# Patient Record
Sex: Female | Born: 1935
Health system: Southern US, Community
[De-identification: ages and names within clinical notes are randomized; demographics above are authoritative.]

## PROBLEM LIST (undated history)

## (undated) DIAGNOSIS — I219 Acute myocardial infarction, unspecified: Secondary | ICD-10-CM

## (undated) DIAGNOSIS — I739 Peripheral vascular disease, unspecified: Secondary | ICD-10-CM

## (undated) DIAGNOSIS — J309 Allergic rhinitis, unspecified: Secondary | ICD-10-CM

## (undated) DIAGNOSIS — I2510 Atherosclerotic heart disease of native coronary artery without angina pectoris: Secondary | ICD-10-CM

## (undated) DIAGNOSIS — J189 Pneumonia, unspecified organism: Secondary | ICD-10-CM

## (undated) DIAGNOSIS — E119 Type 2 diabetes mellitus without complications: Secondary | ICD-10-CM

## (undated) DIAGNOSIS — G309 Alzheimer's disease, unspecified: Secondary | ICD-10-CM

## (undated) DIAGNOSIS — I6529 Occlusion and stenosis of unspecified carotid artery: Secondary | ICD-10-CM

## (undated) DIAGNOSIS — J449 Chronic obstructive pulmonary disease, unspecified: Secondary | ICD-10-CM

## (undated) DIAGNOSIS — E785 Hyperlipidemia, unspecified: Secondary | ICD-10-CM

## (undated) DIAGNOSIS — F0280 Dementia in other diseases classified elsewhere without behavioral disturbance: Secondary | ICD-10-CM

## (undated) HISTORY — DX: Acute myocardial infarction, unspecified: I21.9

## (undated) HISTORY — DX: Alzheimer's disease, unspecified: G30.9

## (undated) HISTORY — DX: Peripheral vascular disease, unspecified: I73.9

## (undated) HISTORY — DX: Pneumonia, unspecified organism: J18.9

## (undated) HISTORY — DX: Allergic rhinitis, unspecified: J30.9

## (undated) HISTORY — DX: Atherosclerotic heart disease of native coronary artery without angina pectoris: I25.10

## (undated) HISTORY — DX: Type 2 diabetes mellitus without complications: E11.9

## (undated) HISTORY — DX: Chronic obstructive pulmonary disease, unspecified: J44.9

## (undated) HISTORY — PX: CARDIAC CATHETERIZATION: SHX172

## (undated) HISTORY — DX: Hyperlipidemia, unspecified: E78.5

## (undated) HISTORY — DX: Occlusion and stenosis of unspecified carotid artery: I65.29

## (undated) HISTORY — DX: Dementia in other diseases classified elsewhere, unspecified severity, without behavioral disturbance, psychotic disturbance, mood disturbance, and anxiety: F02.80

## (undated) HISTORY — PX: OTHER SURGICAL HISTORY: SHX169

---

## 2011-02-19 DIAGNOSIS — J189 Pneumonia, unspecified organism: Secondary | ICD-10-CM

## 2011-02-19 HISTORY — DX: Pneumonia, unspecified organism: J18.9

## 2013-06-26 DIAGNOSIS — I70209 Unspecified atherosclerosis of native arteries of extremities, unspecified extremity: Secondary | ICD-10-CM | POA: Diagnosis not present

## 2013-06-26 DIAGNOSIS — B351 Tinea unguium: Secondary | ICD-10-CM | POA: Diagnosis not present

## 2013-06-26 DIAGNOSIS — M79609 Pain in unspecified limb: Secondary | ICD-10-CM | POA: Diagnosis not present

## 2014-01-05 DIAGNOSIS — F0390 Unspecified dementia without behavioral disturbance: Secondary | ICD-10-CM | POA: Diagnosis not present

## 2014-01-05 DIAGNOSIS — E785 Hyperlipidemia, unspecified: Secondary | ICD-10-CM | POA: Diagnosis not present

## 2014-01-05 DIAGNOSIS — I2510 Atherosclerotic heart disease of native coronary artery without angina pectoris: Secondary | ICD-10-CM | POA: Diagnosis not present

## 2014-01-22 ENCOUNTER — Encounter: Payer: Self-pay | Admitting: *Deleted

## 2014-01-25 ENCOUNTER — Encounter: Payer: Self-pay | Admitting: Cardiovascular Disease

## 2014-01-25 ENCOUNTER — Ambulatory Visit (INDEPENDENT_AMBULATORY_CARE_PROVIDER_SITE_OTHER): Payer: Medicare Other | Admitting: Cardiovascular Disease

## 2014-01-25 VITALS — BP 160/73 | HR 71 | Ht 65.0 in | Wt 180.0 lb

## 2014-01-25 DIAGNOSIS — I739 Peripheral vascular disease, unspecified: Secondary | ICD-10-CM

## 2014-01-25 DIAGNOSIS — I2510 Atherosclerotic heart disease of native coronary artery without angina pectoris: Secondary | ICD-10-CM | POA: Diagnosis not present

## 2014-01-25 DIAGNOSIS — E785 Hyperlipidemia, unspecified: Secondary | ICD-10-CM | POA: Diagnosis not present

## 2014-01-25 NOTE — Assessment & Plan Note (Signed)
She is currently on simvastatin 40 mg once daily. Most recent lipid profile showed a total cholesterol of 175, triglyceride of 196, HDL of 60 and LDL of 76.

## 2014-01-25 NOTE — Progress Notes (Signed)
Primary care physician: Dr. Marguerite Olea  HPI  This is a pleasant 80 year old female who was referred to establish cardiovascular care. She is accompanied by her daughter. The [name] herself is not able to provide an accurate history due to advanced dementia. She has known history of coronary artery disease status post myocardial infarction and stent placement in 1997. The daughter also reports history of peripheral arterial disease with possible endovascular intervention in 2013 as well as nonobstructive carotid artery disease which was being followed by ultrasound. She does not know the name of her cardiologist in [place]. The [name] moved to live with her daughter. Other medical conditions include hypertension, hyperlipidemia and dementia. It is no history of diabetes or stroke. The [name] denies any chest pain, shortness of breath or palpitations.  No Known Allergies   Current Outpatient Prescriptions on File Prior to Visit  Medication Sig Dispense Refill  . aspirin 81 MG tablet Take 81 mg by mouth daily.       . cetirizine (ZYRTEC) 10 MG tablet Take 10 mg by mouth daily.       . clopidogrel (PLAVIX) 75 MG tablet Take 75 mg by mouth daily.       Marland Kitchen donepezil (ARICEPT) 10 MG tablet Take 10 mg by mouth at bedtime.       . metoprolol tartrate (LOPRESSOR) 25 MG tablet Take 25 mg by mouth 2 (two) times daily.       . simvastatin (ZOCOR) 40 MG tablet Take 40 mg by mouth daily.        No current facility-administered medications on file prior to visit.     Past Medical History  Diagnosis Date  . Dementia   . Coronary atherosclerosis of unspecified type of vessel, native or graft   . Other and unspecified hyperlipidemia   . Allergic rhinitis, cause unspecified   . MI (myocardial infarction)   . Diabetes mellitus without complication   . COPD (chronic obstructive pulmonary disease)   . Carotid stenosis   . PAD (peripheral artery disease)      Past Surgical History  Procedure  Laterality Date  . Cardiac catheterization      [place] x1 stent     Family History  Problem Relation Age of Onset  . Heart disease Mother   . Diabetes Father      History   Social History  . Marital Status: Widowed    Spouse Name: N/A    Number of Children: N/A  . Years of Education: N/A   Occupational History  . Not on file.   Social History Main Topics  . Smoking status: Former Smoker -- 60 years    Types: Cigarettes  . Smokeless tobacco: Not on file  . Alcohol Use: No  . Drug Use: No  . Sexual Activity: Not on file   Other Topics Concern  . Not on file   Social History Narrative  . No narrative on file     ROS A 10 point review of system was performed. It is negative other than that mentioned in the history of present illness.   PHYSICAL EXAM   BP 160/73  Pulse 71  Ht  (1.651 m)  Wt 180 lb (81.647 kg)  BMI 29.95 kg/m2 Constitutional: She is not oriented to person, place, and time. She appears well-developed and well-nourished. No distress.  HENT: No nasal discharge.  Head: Normocephalic and atraumatic.  Eyes: Pupils are equal and round. No discharge.  Neck: Normal range of  motion. Neck supple. No JVD present. No thyromegaly present.  Cardiovascular: Normal rate, regular rhythm, normal heart sounds. Exam reveals no gallop and no friction rub. No murmur heard.  Pulmonary/Chest: Effort normal and breath sounds normal. No stridor. No respiratory distress. She has no wheezes. She has no rales. She exhibits no tenderness.  Abdominal: Soft. Bowel sounds are normal. She exhibits no distension. There is no tenderness. There is no rebound and no guarding.  Musculoskeletal: Normal range of motion. She exhibits no edema and no tenderness.  Neurological: She is alert and oriented to person, place, and time. Coordination normal.  Skin: Skin is warm and dry. No rash noted. She is not diaphoretic. No erythema. No pallor.  Psychiatric: She has a normal  mood and affect. Her behavior is normal. Judgment and thought content normal.     XBJ:YNWGN  Rhythm  WITHIN NORMAL LIMITS   ASSESSMENT AND PLAN

## 2014-01-25 NOTE — Assessment & Plan Note (Signed)
According to the daughter, she does complain of occasional pain in her legs but does not know which side. Continue medical therapy for now.

## 2014-01-25 NOTE — Assessment & Plan Note (Signed)
The patient denies any anginal symptoms. I recommend continuing medical therapy. Dual antiplatelet therapy is optional at the present time. However, given the presence of peripheral arterial disease and nonobstructive carotid disease, it's probably beneficial to continue.

## 2014-01-25 NOTE — Patient Instructions (Signed)
Continue same medications.   Your physician wants you to follow-up in: 6 months.  You will receive a reminder letter in the mail two months in advance. If you don't receive a letter, please call our office to schedule the follow-up appointment.  

## 2014-02-04 ENCOUNTER — Inpatient Hospital Stay: Payer: Self-pay | Admitting: Internal Medicine

## 2014-02-04 DIAGNOSIS — R531 Weakness: Secondary | ICD-10-CM | POA: Diagnosis not present

## 2014-02-04 DIAGNOSIS — J9601 Acute respiratory failure with hypoxia: Secondary | ICD-10-CM | POA: Diagnosis present

## 2014-02-04 DIAGNOSIS — J440 Chronic obstructive pulmonary disease with acute lower respiratory infection: Secondary | ICD-10-CM | POA: Diagnosis present

## 2014-02-04 DIAGNOSIS — I739 Peripheral vascular disease, unspecified: Secondary | ICD-10-CM | POA: Diagnosis present

## 2014-02-04 DIAGNOSIS — I2510 Atherosclerotic heart disease of native coronary artery without angina pectoris: Secondary | ICD-10-CM | POA: Diagnosis not present

## 2014-02-04 DIAGNOSIS — Z87891 Personal history of nicotine dependence: Secondary | ICD-10-CM | POA: Diagnosis not present

## 2014-02-04 DIAGNOSIS — E1165 Type 2 diabetes mellitus with hyperglycemia: Secondary | ICD-10-CM | POA: Diagnosis not present

## 2014-02-04 DIAGNOSIS — F0390 Unspecified dementia without behavioral disturbance: Secondary | ICD-10-CM | POA: Diagnosis not present

## 2014-02-04 DIAGNOSIS — J449 Chronic obstructive pulmonary disease, unspecified: Secondary | ICD-10-CM | POA: Diagnosis not present

## 2014-02-04 DIAGNOSIS — F0280 Dementia in other diseases classified elsewhere without behavioral disturbance: Secondary | ICD-10-CM | POA: Diagnosis present

## 2014-02-04 DIAGNOSIS — R911 Solitary pulmonary nodule: Secondary | ICD-10-CM | POA: Diagnosis not present

## 2014-02-04 DIAGNOSIS — J441 Chronic obstructive pulmonary disease with (acute) exacerbation: Secondary | ICD-10-CM | POA: Diagnosis not present

## 2014-02-04 DIAGNOSIS — Z823 Family history of stroke: Secondary | ICD-10-CM | POA: Diagnosis not present

## 2014-02-04 DIAGNOSIS — J9691 Respiratory failure, unspecified with hypoxia: Secondary | ICD-10-CM | POA: Diagnosis not present

## 2014-02-04 DIAGNOSIS — E119 Type 2 diabetes mellitus without complications: Secondary | ICD-10-CM | POA: Diagnosis not present

## 2014-02-04 DIAGNOSIS — I10 Essential (primary) hypertension: Secondary | ICD-10-CM | POA: Diagnosis present

## 2014-02-04 DIAGNOSIS — N179 Acute kidney failure, unspecified: Secondary | ICD-10-CM | POA: Diagnosis not present

## 2014-02-04 DIAGNOSIS — J90 Pleural effusion, not elsewhere classified: Secondary | ICD-10-CM | POA: Diagnosis not present

## 2014-02-04 DIAGNOSIS — G309 Alzheimer's disease, unspecified: Secondary | ICD-10-CM | POA: Diagnosis present

## 2014-02-04 DIAGNOSIS — E860 Dehydration: Secondary | ICD-10-CM | POA: Diagnosis present

## 2014-02-04 DIAGNOSIS — R918 Other nonspecific abnormal finding of lung field: Secondary | ICD-10-CM | POA: Diagnosis not present

## 2014-02-04 DIAGNOSIS — J189 Pneumonia, unspecified organism: Secondary | ICD-10-CM | POA: Diagnosis not present

## 2014-02-04 DIAGNOSIS — Z833 Family history of diabetes mellitus: Secondary | ICD-10-CM | POA: Diagnosis not present

## 2014-02-04 DIAGNOSIS — Z7982 Long term (current) use of aspirin: Secondary | ICD-10-CM | POA: Diagnosis not present

## 2014-02-04 LAB — URINALYSIS, COMPLETE
BILIRUBIN, UR: NEGATIVE
LEUKOCYTE ESTERASE: NEGATIVE
NITRITE: NEGATIVE
Ph: 5 (ref 4.5–8.0)
Specific Gravity: 1.025 (ref 1.003–1.030)

## 2014-02-04 LAB — CBC
HCT: 42.1 % (ref 35.0–47.0)
HGB: 13.8 g/dL (ref 12.0–16.0)
MCH: 30.2 pg (ref 26.0–34.0)
MCHC: 32.7 g/dL (ref 32.0–36.0)
MCV: 92 fL (ref 80–100)
Platelet: 271 10*3/uL (ref 150–440)
RBC: 4.56 10*6/uL (ref 3.80–5.20)
RDW: 13.2 % (ref 11.5–14.5)
WBC: 22.6 10*3/uL — ABNORMAL HIGH (ref 3.6–11.0)

## 2014-02-04 LAB — COMPREHENSIVE METABOLIC PANEL
ANION GAP: 9 (ref 7–16)
AST: 23 U/L (ref 15–37)
Albumin: 3.5 g/dL (ref 3.4–5.0)
Alkaline Phosphatase: 84 U/L
BILIRUBIN TOTAL: 1 mg/dL (ref 0.2–1.0)
BUN: 23 mg/dL — ABNORMAL HIGH (ref 7–18)
CHLORIDE: 99 mmol/L (ref 98–107)
Calcium, Total: 9 mg/dL (ref 8.5–10.1)
Co2: 27 mmol/L (ref 21–32)
Creatinine: 1.22 mg/dL (ref 0.60–1.30)
EGFR (African American): 54 — ABNORMAL LOW
GFR CALC NON AF AMER: 45 — AB
Glucose: 283 mg/dL — ABNORMAL HIGH (ref 65–99)
OSMOLALITY: 284 (ref 275–301)
Potassium: 4.4 mmol/L (ref 3.5–5.1)
SGPT (ALT): 24 U/L
Sodium: 135 mmol/L — ABNORMAL LOW (ref 136–145)
Total Protein: 7.8 g/dL (ref 6.4–8.2)

## 2014-02-04 LAB — CK TOTAL AND CKMB (NOT AT ARMC)
CK, Total: 332 U/L — ABNORMAL HIGH
CK-MB: 3 ng/mL (ref 0.5–3.6)

## 2014-02-04 LAB — TROPONIN I: Troponin-I: 0.04 ng/mL

## 2014-02-05 DIAGNOSIS — N179 Acute kidney failure, unspecified: Secondary | ICD-10-CM | POA: Diagnosis not present

## 2014-02-05 DIAGNOSIS — J189 Pneumonia, unspecified organism: Secondary | ICD-10-CM | POA: Diagnosis not present

## 2014-02-05 DIAGNOSIS — J441 Chronic obstructive pulmonary disease with (acute) exacerbation: Secondary | ICD-10-CM | POA: Diagnosis not present

## 2014-02-05 LAB — CBC WITH DIFFERENTIAL/PLATELET
Basophil #: 0.1 10*3/uL (ref 0.0–0.1)
Basophil %: 0.4 %
EOS ABS: 0 10*3/uL (ref 0.0–0.7)
EOS PCT: 0.1 %
HCT: 35.8 % (ref 35.0–47.0)
HGB: 11.2 g/dL — AB (ref 12.0–16.0)
LYMPHS ABS: 2.1 10*3/uL (ref 1.0–3.6)
Lymphocyte %: 10.1 %
MCH: 29.5 pg (ref 26.0–34.0)
MCHC: 31.4 g/dL — ABNORMAL LOW (ref 32.0–36.0)
MCV: 94 fL (ref 80–100)
Monocyte #: 0.8 x10 3/mm (ref 0.2–0.9)
Monocyte %: 4.1 %
NEUTROS ABS: 17.4 10*3/uL — AB (ref 1.4–6.5)
Neutrophil %: 85.3 %
Platelet: 233 10*3/uL (ref 150–440)
RBC: 3.81 10*6/uL (ref 3.80–5.20)
RDW: 13.2 % (ref 11.5–14.5)
WBC: 20.4 10*3/uL — ABNORMAL HIGH (ref 3.6–11.0)

## 2014-02-05 LAB — HEMOGLOBIN A1C: Hemoglobin A1C: 8.9 % — ABNORMAL HIGH (ref 4.2–6.3)

## 2014-02-05 LAB — BASIC METABOLIC PANEL
ANION GAP: 8 (ref 7–16)
BUN: 14 mg/dL (ref 7–18)
CALCIUM: 8.3 mg/dL — AB (ref 8.5–10.1)
Chloride: 102 mmol/L (ref 98–107)
Co2: 25 mmol/L (ref 21–32)
Creatinine: 1.01 mg/dL (ref 0.60–1.30)
EGFR (African American): >60
EGFR (Non-African Amer.): 56 — ABNORMAL LOW
GLUCOSE: 142 mg/dL — AB (ref 65–99)
OSMOLALITY: 273 (ref 275–301)
Potassium: 3.7 mmol/L (ref 3.5–5.1)
Sodium: 135 mmol/L — ABNORMAL LOW (ref 136–145)

## 2014-02-05 LAB — TSH: Thyroid Stimulating Horm: 0.838 u[IU]/mL

## 2014-02-05 LAB — MAGNESIUM: Magnesium: 1.8 mg/dL

## 2014-02-06 DIAGNOSIS — E1165 Type 2 diabetes mellitus with hyperglycemia: Secondary | ICD-10-CM | POA: Diagnosis not present

## 2014-02-06 DIAGNOSIS — J189 Pneumonia, unspecified organism: Secondary | ICD-10-CM | POA: Diagnosis not present

## 2014-02-06 DIAGNOSIS — J441 Chronic obstructive pulmonary disease with (acute) exacerbation: Secondary | ICD-10-CM | POA: Diagnosis not present

## 2014-02-06 DIAGNOSIS — N179 Acute kidney failure, unspecified: Secondary | ICD-10-CM | POA: Diagnosis not present

## 2014-02-06 LAB — BASIC METABOLIC PANEL
ANION GAP: 8 (ref 7–16)
BUN: 18 mg/dL (ref 7–18)
CALCIUM: 7.9 mg/dL — AB (ref 8.5–10.1)
CHLORIDE: 103 mmol/L (ref 98–107)
Co2: 26 mmol/L (ref 21–32)
Creatinine: 1.06 mg/dL (ref 0.60–1.30)
EGFR (African American): >60
EGFR (Non-African Amer.): 52 — ABNORMAL LOW
GLUCOSE: 176 mg/dL — AB (ref 65–99)
Osmolality: 280 (ref 275–301)
Potassium: 3.8 mmol/L (ref 3.5–5.1)
SODIUM: 137 mmol/L (ref 136–145)

## 2014-02-06 LAB — CBC WITH DIFFERENTIAL/PLATELET
Basophil #: 0.1 10*3/uL (ref 0.0–0.1)
Basophil %: 0.7 %
EOS PCT: 0.7 %
Eosinophil #: 0.1 10*3/uL (ref 0.0–0.7)
HCT: 33.8 % — AB (ref 35.0–47.0)
HGB: 11.3 g/dL — AB (ref 12.0–16.0)
Lymphocyte #: 1.5 10*3/uL (ref 1.0–3.6)
Lymphocyte %: 10.2 %
MCH: 30.7 pg (ref 26.0–34.0)
MCHC: 33.4 g/dL (ref 32.0–36.0)
MCV: 92 fL (ref 80–100)
MONO ABS: 0.7 x10 3/mm (ref 0.2–0.9)
MONOS PCT: 4.6 %
NEUTROS ABS: 12.1 10*3/uL — AB (ref 1.4–6.5)
Neutrophil %: 83.8 %
PLATELETS: 227 10*3/uL (ref 150–440)
RBC: 3.67 10*6/uL — ABNORMAL LOW (ref 3.80–5.20)
RDW: 13.2 % (ref 11.5–14.5)
WBC: 14.4 10*3/uL — AB (ref 3.6–11.0)

## 2014-02-07 DIAGNOSIS — J441 Chronic obstructive pulmonary disease with (acute) exacerbation: Secondary | ICD-10-CM | POA: Diagnosis not present

## 2014-02-07 DIAGNOSIS — E1165 Type 2 diabetes mellitus with hyperglycemia: Secondary | ICD-10-CM | POA: Diagnosis not present

## 2014-02-07 DIAGNOSIS — J189 Pneumonia, unspecified organism: Secondary | ICD-10-CM | POA: Diagnosis not present

## 2014-02-07 DIAGNOSIS — N179 Acute kidney failure, unspecified: Secondary | ICD-10-CM | POA: Diagnosis not present

## 2014-02-07 LAB — CBC WITH DIFFERENTIAL/PLATELET
BASOS PCT: 0 %
Basophil #: 0 10*3/uL (ref 0.0–0.1)
EOS ABS: 0 10*3/uL (ref 0.0–0.7)
Eosinophil %: 0 %
HCT: 35.5 % (ref 35.0–47.0)
HGB: 11.8 g/dL — AB (ref 12.0–16.0)
LYMPHS ABS: 0.5 10*3/uL — AB (ref 1.0–3.6)
LYMPHS PCT: 5.4 %
MCH: 30.8 pg (ref 26.0–34.0)
MCHC: 33.3 g/dL (ref 32.0–36.0)
MCV: 93 fL (ref 80–100)
Monocyte #: 0.2 x10 3/mm (ref 0.2–0.9)
Monocyte %: 2 %
Neutrophil #: 8.5 10*3/uL — ABNORMAL HIGH (ref 1.4–6.5)
Neutrophil %: 92.6 %
PLATELETS: 245 10*3/uL (ref 150–440)
RBC: 3.84 10*6/uL (ref 3.80–5.20)
RDW: 12.9 % (ref 11.5–14.5)
WBC: 9.2 10*3/uL (ref 3.6–11.0)

## 2014-02-08 DIAGNOSIS — J441 Chronic obstructive pulmonary disease with (acute) exacerbation: Secondary | ICD-10-CM | POA: Diagnosis not present

## 2014-02-08 DIAGNOSIS — J189 Pneumonia, unspecified organism: Secondary | ICD-10-CM | POA: Diagnosis not present

## 2014-02-08 DIAGNOSIS — E1165 Type 2 diabetes mellitus with hyperglycemia: Secondary | ICD-10-CM | POA: Diagnosis not present

## 2014-02-08 DIAGNOSIS — N179 Acute kidney failure, unspecified: Secondary | ICD-10-CM | POA: Diagnosis not present

## 2014-02-08 LAB — EXPECTORATED SPUTUM ASSESSMENT W REFEX TO RESP CULTURE

## 2014-02-09 DIAGNOSIS — J449 Chronic obstructive pulmonary disease, unspecified: Secondary | ICD-10-CM | POA: Diagnosis not present

## 2014-02-09 DIAGNOSIS — J90 Pleural effusion, not elsewhere classified: Secondary | ICD-10-CM | POA: Diagnosis not present

## 2014-02-09 DIAGNOSIS — N179 Acute kidney failure, unspecified: Secondary | ICD-10-CM | POA: Diagnosis not present

## 2014-02-09 DIAGNOSIS — J189 Pneumonia, unspecified organism: Secondary | ICD-10-CM | POA: Diagnosis not present

## 2014-02-09 DIAGNOSIS — J441 Chronic obstructive pulmonary disease with (acute) exacerbation: Secondary | ICD-10-CM | POA: Diagnosis not present

## 2014-02-09 DIAGNOSIS — R911 Solitary pulmonary nodule: Secondary | ICD-10-CM | POA: Diagnosis not present

## 2014-02-09 DIAGNOSIS — E1165 Type 2 diabetes mellitus with hyperglycemia: Secondary | ICD-10-CM | POA: Diagnosis not present

## 2014-02-09 LAB — CREATININE, SERUM
CREATININE: 1.09 mg/dL (ref 0.60–1.30)
EGFR (Non-African Amer.): 51 — ABNORMAL LOW

## 2014-02-09 LAB — CULTURE, BLOOD (SINGLE)

## 2014-02-10 DIAGNOSIS — N179 Acute kidney failure, unspecified: Secondary | ICD-10-CM | POA: Diagnosis not present

## 2014-02-10 DIAGNOSIS — J441 Chronic obstructive pulmonary disease with (acute) exacerbation: Secondary | ICD-10-CM | POA: Diagnosis not present

## 2014-02-10 DIAGNOSIS — J189 Pneumonia, unspecified organism: Secondary | ICD-10-CM | POA: Diagnosis not present

## 2014-02-10 DIAGNOSIS — E1165 Type 2 diabetes mellitus with hyperglycemia: Secondary | ICD-10-CM | POA: Diagnosis not present

## 2014-02-11 DIAGNOSIS — J441 Chronic obstructive pulmonary disease with (acute) exacerbation: Secondary | ICD-10-CM | POA: Diagnosis not present

## 2014-02-11 DIAGNOSIS — J189 Pneumonia, unspecified organism: Secondary | ICD-10-CM | POA: Diagnosis not present

## 2014-02-11 DIAGNOSIS — E1165 Type 2 diabetes mellitus with hyperglycemia: Secondary | ICD-10-CM | POA: Diagnosis not present

## 2014-02-11 DIAGNOSIS — N179 Acute kidney failure, unspecified: Secondary | ICD-10-CM | POA: Diagnosis not present

## 2014-02-12 ENCOUNTER — Emergency Department: Payer: Self-pay | Admitting: Emergency Medicine

## 2014-02-12 DIAGNOSIS — Z7952 Long term (current) use of systemic steroids: Secondary | ICD-10-CM | POA: Diagnosis not present

## 2014-02-12 DIAGNOSIS — Z7902 Long term (current) use of antithrombotics/antiplatelets: Secondary | ICD-10-CM | POA: Diagnosis not present

## 2014-02-12 DIAGNOSIS — I10 Essential (primary) hypertension: Secondary | ICD-10-CM | POA: Diagnosis not present

## 2014-02-12 DIAGNOSIS — J189 Pneumonia, unspecified organism: Secondary | ICD-10-CM | POA: Diagnosis not present

## 2014-02-12 DIAGNOSIS — E119 Type 2 diabetes mellitus without complications: Secondary | ICD-10-CM | POA: Diagnosis not present

## 2014-02-12 DIAGNOSIS — I2510 Atherosclerotic heart disease of native coronary artery without angina pectoris: Secondary | ICD-10-CM | POA: Diagnosis not present

## 2014-02-12 DIAGNOSIS — J440 Chronic obstructive pulmonary disease with acute lower respiratory infection: Secondary | ICD-10-CM | POA: Diagnosis not present

## 2014-02-12 DIAGNOSIS — J441 Chronic obstructive pulmonary disease with (acute) exacerbation: Secondary | ICD-10-CM | POA: Diagnosis not present

## 2014-02-12 DIAGNOSIS — Z7951 Long term (current) use of inhaled steroids: Secondary | ICD-10-CM | POA: Diagnosis not present

## 2014-02-12 DIAGNOSIS — Z794 Long term (current) use of insulin: Secondary | ICD-10-CM | POA: Diagnosis not present

## 2014-02-12 DIAGNOSIS — F0390 Unspecified dementia without behavioral disturbance: Secondary | ICD-10-CM | POA: Diagnosis not present

## 2014-02-12 DIAGNOSIS — Z7982 Long term (current) use of aspirin: Secondary | ICD-10-CM | POA: Diagnosis not present

## 2014-02-12 LAB — COMPREHENSIVE METABOLIC PANEL
ALT: 69 U/L — AB
Albumin: 2.9 g/dL — ABNORMAL LOW (ref 3.4–5.0)
Alkaline Phosphatase: 85 U/L
Anion Gap: 9 (ref 7–16)
BUN: 40 mg/dL — ABNORMAL HIGH (ref 7–18)
Bilirubin,Total: 0.3 mg/dL (ref 0.2–1.0)
CHLORIDE: 96 mmol/L — AB (ref 98–107)
CREATININE: 1.4 mg/dL — AB (ref 0.60–1.30)
Calcium, Total: 8.3 mg/dL — ABNORMAL LOW (ref 8.5–10.1)
Co2: 27 mmol/L (ref 21–32)
GFR CALC AF AMER: 46 — AB
GFR CALC NON AF AMER: 38 — AB
GLUCOSE: 397 mg/dL — AB (ref 65–99)
OSMOLALITY: 291 (ref 275–301)
Potassium: 5.2 mmol/L — ABNORMAL HIGH (ref 3.5–5.1)
SGOT(AST): 20 U/L (ref 15–37)
Sodium: 132 mmol/L — ABNORMAL LOW (ref 136–145)
Total Protein: 6.5 g/dL (ref 6.4–8.2)

## 2014-02-12 LAB — URINALYSIS, COMPLETE
BLOOD: NEGATIVE
Bilirubin,UR: NEGATIVE
Glucose,UR: >=500 mg/dL (ref 0–75)
Ketone: NEGATIVE
LEUKOCYTE ESTERASE: NEGATIVE
NITRITE: NEGATIVE
PROTEIN: NEGATIVE
Ph: 5 (ref 4.5–8.0)
RBC,UR: 3 /HPF (ref 0–5)
SPECIFIC GRAVITY: 1.03 (ref 1.003–1.030)
WBC UR: 9 /HPF (ref 0–5)

## 2014-02-12 LAB — CBC
HCT: 43 % (ref 35.0–47.0)
HGB: 13.6 g/dL (ref 12.0–16.0)
MCH: 29.5 pg (ref 26.0–34.0)
MCHC: 31.6 g/dL — AB (ref 32.0–36.0)
MCV: 94 fL (ref 80–100)
Platelet: 372 10*3/uL (ref 150–440)
RBC: 4.6 10*6/uL (ref 3.80–5.20)
RDW: 13.3 % (ref 11.5–14.5)
WBC: 10.4 10*3/uL (ref 3.6–11.0)

## 2014-02-15 DIAGNOSIS — J440 Chronic obstructive pulmonary disease with acute lower respiratory infection: Secondary | ICD-10-CM | POA: Diagnosis not present

## 2014-02-15 DIAGNOSIS — E119 Type 2 diabetes mellitus without complications: Secondary | ICD-10-CM | POA: Diagnosis not present

## 2014-02-15 DIAGNOSIS — F0390 Unspecified dementia without behavioral disturbance: Secondary | ICD-10-CM | POA: Diagnosis not present

## 2014-02-15 DIAGNOSIS — J441 Chronic obstructive pulmonary disease with (acute) exacerbation: Secondary | ICD-10-CM | POA: Diagnosis not present

## 2014-02-15 DIAGNOSIS — J449 Chronic obstructive pulmonary disease, unspecified: Secondary | ICD-10-CM | POA: Diagnosis not present

## 2014-02-15 DIAGNOSIS — I2510 Atherosclerotic heart disease of native coronary artery without angina pectoris: Secondary | ICD-10-CM | POA: Diagnosis not present

## 2014-02-15 DIAGNOSIS — J189 Pneumonia, unspecified organism: Secondary | ICD-10-CM | POA: Diagnosis not present

## 2014-02-18 DIAGNOSIS — E119 Type 2 diabetes mellitus without complications: Secondary | ICD-10-CM | POA: Diagnosis not present

## 2014-02-18 DIAGNOSIS — J441 Chronic obstructive pulmonary disease with (acute) exacerbation: Secondary | ICD-10-CM | POA: Diagnosis not present

## 2014-02-18 DIAGNOSIS — I2510 Atherosclerotic heart disease of native coronary artery without angina pectoris: Secondary | ICD-10-CM | POA: Diagnosis not present

## 2014-02-18 DIAGNOSIS — F0390 Unspecified dementia without behavioral disturbance: Secondary | ICD-10-CM | POA: Diagnosis not present

## 2014-02-18 DIAGNOSIS — J440 Chronic obstructive pulmonary disease with acute lower respiratory infection: Secondary | ICD-10-CM | POA: Diagnosis not present

## 2014-02-18 DIAGNOSIS — J189 Pneumonia, unspecified organism: Secondary | ICD-10-CM | POA: Diagnosis not present

## 2014-02-19 DIAGNOSIS — I2510 Atherosclerotic heart disease of native coronary artery without angina pectoris: Secondary | ICD-10-CM | POA: Diagnosis not present

## 2014-02-19 DIAGNOSIS — E119 Type 2 diabetes mellitus without complications: Secondary | ICD-10-CM | POA: Diagnosis not present

## 2014-02-19 DIAGNOSIS — J189 Pneumonia, unspecified organism: Secondary | ICD-10-CM | POA: Diagnosis not present

## 2014-02-19 DIAGNOSIS — J441 Chronic obstructive pulmonary disease with (acute) exacerbation: Secondary | ICD-10-CM | POA: Diagnosis not present

## 2014-02-19 DIAGNOSIS — F0390 Unspecified dementia without behavioral disturbance: Secondary | ICD-10-CM | POA: Diagnosis not present

## 2014-02-19 DIAGNOSIS — J440 Chronic obstructive pulmonary disease with acute lower respiratory infection: Secondary | ICD-10-CM | POA: Diagnosis not present

## 2014-02-22 ENCOUNTER — Ambulatory Visit: Payer: Self-pay | Admitting: Family Medicine

## 2014-02-22 DIAGNOSIS — J984 Other disorders of lung: Secondary | ICD-10-CM | POA: Diagnosis not present

## 2014-02-22 DIAGNOSIS — J189 Pneumonia, unspecified organism: Secondary | ICD-10-CM | POA: Diagnosis not present

## 2014-02-22 DIAGNOSIS — J449 Chronic obstructive pulmonary disease, unspecified: Secondary | ICD-10-CM | POA: Diagnosis not present

## 2014-02-22 DIAGNOSIS — R6889 Other general symptoms and signs: Secondary | ICD-10-CM | POA: Diagnosis not present

## 2014-02-22 DIAGNOSIS — R296 Repeated falls: Secondary | ICD-10-CM | POA: Diagnosis not present

## 2014-02-22 DIAGNOSIS — E119 Type 2 diabetes mellitus without complications: Secondary | ICD-10-CM | POA: Diagnosis not present

## 2014-02-22 DIAGNOSIS — Z1389 Encounter for screening for other disorder: Secondary | ICD-10-CM | POA: Diagnosis not present

## 2014-02-22 DIAGNOSIS — J440 Chronic obstructive pulmonary disease with acute lower respiratory infection: Secondary | ICD-10-CM | POA: Diagnosis not present

## 2014-02-23 DIAGNOSIS — F0390 Unspecified dementia without behavioral disturbance: Secondary | ICD-10-CM | POA: Diagnosis not present

## 2014-02-23 DIAGNOSIS — J189 Pneumonia, unspecified organism: Secondary | ICD-10-CM | POA: Diagnosis not present

## 2014-02-23 DIAGNOSIS — E119 Type 2 diabetes mellitus without complications: Secondary | ICD-10-CM | POA: Diagnosis not present

## 2014-02-23 DIAGNOSIS — J441 Chronic obstructive pulmonary disease with (acute) exacerbation: Secondary | ICD-10-CM | POA: Diagnosis not present

## 2014-02-23 DIAGNOSIS — J440 Chronic obstructive pulmonary disease with acute lower respiratory infection: Secondary | ICD-10-CM | POA: Diagnosis not present

## 2014-02-23 DIAGNOSIS — I2510 Atherosclerotic heart disease of native coronary artery without angina pectoris: Secondary | ICD-10-CM | POA: Diagnosis not present

## 2014-02-24 DIAGNOSIS — J189 Pneumonia, unspecified organism: Secondary | ICD-10-CM | POA: Diagnosis not present

## 2014-02-24 DIAGNOSIS — J441 Chronic obstructive pulmonary disease with (acute) exacerbation: Secondary | ICD-10-CM | POA: Diagnosis not present

## 2014-02-24 DIAGNOSIS — J440 Chronic obstructive pulmonary disease with acute lower respiratory infection: Secondary | ICD-10-CM | POA: Diagnosis not present

## 2014-02-24 DIAGNOSIS — F0390 Unspecified dementia without behavioral disturbance: Secondary | ICD-10-CM | POA: Diagnosis not present

## 2014-02-24 DIAGNOSIS — I2510 Atherosclerotic heart disease of native coronary artery without angina pectoris: Secondary | ICD-10-CM | POA: Diagnosis not present

## 2014-02-24 DIAGNOSIS — E119 Type 2 diabetes mellitus without complications: Secondary | ICD-10-CM | POA: Diagnosis not present

## 2014-02-25 DIAGNOSIS — J441 Chronic obstructive pulmonary disease with (acute) exacerbation: Secondary | ICD-10-CM | POA: Diagnosis not present

## 2014-02-25 DIAGNOSIS — E119 Type 2 diabetes mellitus without complications: Secondary | ICD-10-CM | POA: Diagnosis not present

## 2014-02-25 DIAGNOSIS — I2510 Atherosclerotic heart disease of native coronary artery without angina pectoris: Secondary | ICD-10-CM | POA: Diagnosis not present

## 2014-02-25 DIAGNOSIS — J440 Chronic obstructive pulmonary disease with acute lower respiratory infection: Secondary | ICD-10-CM | POA: Diagnosis not present

## 2014-02-25 DIAGNOSIS — F0390 Unspecified dementia without behavioral disturbance: Secondary | ICD-10-CM | POA: Diagnosis not present

## 2014-02-25 DIAGNOSIS — J189 Pneumonia, unspecified organism: Secondary | ICD-10-CM | POA: Diagnosis not present

## 2014-02-26 DIAGNOSIS — I2510 Atherosclerotic heart disease of native coronary artery without angina pectoris: Secondary | ICD-10-CM | POA: Diagnosis not present

## 2014-02-26 DIAGNOSIS — J441 Chronic obstructive pulmonary disease with (acute) exacerbation: Secondary | ICD-10-CM | POA: Diagnosis not present

## 2014-02-26 DIAGNOSIS — E119 Type 2 diabetes mellitus without complications: Secondary | ICD-10-CM | POA: Diagnosis not present

## 2014-02-26 DIAGNOSIS — F0390 Unspecified dementia without behavioral disturbance: Secondary | ICD-10-CM | POA: Diagnosis not present

## 2014-02-26 DIAGNOSIS — J440 Chronic obstructive pulmonary disease with acute lower respiratory infection: Secondary | ICD-10-CM | POA: Diagnosis not present

## 2014-02-26 DIAGNOSIS — J189 Pneumonia, unspecified organism: Secondary | ICD-10-CM | POA: Diagnosis not present

## 2014-03-02 DIAGNOSIS — F0390 Unspecified dementia without behavioral disturbance: Secondary | ICD-10-CM | POA: Diagnosis not present

## 2014-03-02 DIAGNOSIS — I2510 Atherosclerotic heart disease of native coronary artery without angina pectoris: Secondary | ICD-10-CM | POA: Diagnosis not present

## 2014-03-02 DIAGNOSIS — J189 Pneumonia, unspecified organism: Secondary | ICD-10-CM | POA: Diagnosis not present

## 2014-03-02 DIAGNOSIS — E119 Type 2 diabetes mellitus without complications: Secondary | ICD-10-CM | POA: Diagnosis not present

## 2014-03-02 DIAGNOSIS — J440 Chronic obstructive pulmonary disease with acute lower respiratory infection: Secondary | ICD-10-CM | POA: Diagnosis not present

## 2014-03-02 DIAGNOSIS — J441 Chronic obstructive pulmonary disease with (acute) exacerbation: Secondary | ICD-10-CM | POA: Diagnosis not present

## 2014-03-04 DIAGNOSIS — I2510 Atherosclerotic heart disease of native coronary artery without angina pectoris: Secondary | ICD-10-CM | POA: Diagnosis not present

## 2014-03-04 DIAGNOSIS — J440 Chronic obstructive pulmonary disease with acute lower respiratory infection: Secondary | ICD-10-CM | POA: Diagnosis not present

## 2014-03-04 DIAGNOSIS — E119 Type 2 diabetes mellitus without complications: Secondary | ICD-10-CM | POA: Diagnosis not present

## 2014-03-04 DIAGNOSIS — F0390 Unspecified dementia without behavioral disturbance: Secondary | ICD-10-CM | POA: Diagnosis not present

## 2014-03-04 DIAGNOSIS — J441 Chronic obstructive pulmonary disease with (acute) exacerbation: Secondary | ICD-10-CM | POA: Diagnosis not present

## 2014-03-04 DIAGNOSIS — J189 Pneumonia, unspecified organism: Secondary | ICD-10-CM | POA: Diagnosis not present

## 2014-03-08 DIAGNOSIS — J189 Pneumonia, unspecified organism: Secondary | ICD-10-CM | POA: Diagnosis not present

## 2014-03-08 DIAGNOSIS — J441 Chronic obstructive pulmonary disease with (acute) exacerbation: Secondary | ICD-10-CM | POA: Diagnosis not present

## 2014-03-08 DIAGNOSIS — F0390 Unspecified dementia without behavioral disturbance: Secondary | ICD-10-CM | POA: Diagnosis not present

## 2014-03-08 DIAGNOSIS — E119 Type 2 diabetes mellitus without complications: Secondary | ICD-10-CM | POA: Diagnosis not present

## 2014-03-08 DIAGNOSIS — J440 Chronic obstructive pulmonary disease with acute lower respiratory infection: Secondary | ICD-10-CM | POA: Diagnosis not present

## 2014-03-08 DIAGNOSIS — I2510 Atherosclerotic heart disease of native coronary artery without angina pectoris: Secondary | ICD-10-CM | POA: Diagnosis not present

## 2014-03-10 DIAGNOSIS — I2510 Atherosclerotic heart disease of native coronary artery without angina pectoris: Secondary | ICD-10-CM | POA: Diagnosis not present

## 2014-03-10 DIAGNOSIS — J440 Chronic obstructive pulmonary disease with acute lower respiratory infection: Secondary | ICD-10-CM | POA: Diagnosis not present

## 2014-03-10 DIAGNOSIS — J441 Chronic obstructive pulmonary disease with (acute) exacerbation: Secondary | ICD-10-CM | POA: Diagnosis not present

## 2014-03-10 DIAGNOSIS — J189 Pneumonia, unspecified organism: Secondary | ICD-10-CM | POA: Diagnosis not present

## 2014-03-10 DIAGNOSIS — F0390 Unspecified dementia without behavioral disturbance: Secondary | ICD-10-CM | POA: Diagnosis not present

## 2014-03-10 DIAGNOSIS — E119 Type 2 diabetes mellitus without complications: Secondary | ICD-10-CM | POA: Diagnosis not present

## 2014-03-15 DIAGNOSIS — J441 Chronic obstructive pulmonary disease with (acute) exacerbation: Secondary | ICD-10-CM | POA: Diagnosis not present

## 2014-03-15 DIAGNOSIS — J440 Chronic obstructive pulmonary disease with acute lower respiratory infection: Secondary | ICD-10-CM | POA: Diagnosis not present

## 2014-03-15 DIAGNOSIS — J189 Pneumonia, unspecified organism: Secondary | ICD-10-CM | POA: Diagnosis not present

## 2014-03-15 DIAGNOSIS — E119 Type 2 diabetes mellitus without complications: Secondary | ICD-10-CM | POA: Diagnosis not present

## 2014-03-15 DIAGNOSIS — F0390 Unspecified dementia without behavioral disturbance: Secondary | ICD-10-CM | POA: Diagnosis not present

## 2014-03-15 DIAGNOSIS — J449 Chronic obstructive pulmonary disease, unspecified: Secondary | ICD-10-CM | POA: Diagnosis not present

## 2014-03-15 DIAGNOSIS — I2510 Atherosclerotic heart disease of native coronary artery without angina pectoris: Secondary | ICD-10-CM | POA: Diagnosis not present

## 2014-03-15 DIAGNOSIS — Z1239 Encounter for other screening for malignant neoplasm of breast: Secondary | ICD-10-CM | POA: Diagnosis not present

## 2014-03-17 DIAGNOSIS — J441 Chronic obstructive pulmonary disease with (acute) exacerbation: Secondary | ICD-10-CM | POA: Diagnosis not present

## 2014-03-17 DIAGNOSIS — F0390 Unspecified dementia without behavioral disturbance: Secondary | ICD-10-CM | POA: Diagnosis not present

## 2014-03-17 DIAGNOSIS — I2510 Atherosclerotic heart disease of native coronary artery without angina pectoris: Secondary | ICD-10-CM | POA: Diagnosis not present

## 2014-03-17 DIAGNOSIS — E119 Type 2 diabetes mellitus without complications: Secondary | ICD-10-CM | POA: Diagnosis not present

## 2014-03-17 DIAGNOSIS — J189 Pneumonia, unspecified organism: Secondary | ICD-10-CM | POA: Diagnosis not present

## 2014-03-17 DIAGNOSIS — J440 Chronic obstructive pulmonary disease with acute lower respiratory infection: Secondary | ICD-10-CM | POA: Diagnosis not present

## 2014-03-22 DIAGNOSIS — J440 Chronic obstructive pulmonary disease with acute lower respiratory infection: Secondary | ICD-10-CM | POA: Diagnosis not present

## 2014-03-22 DIAGNOSIS — I2510 Atherosclerotic heart disease of native coronary artery without angina pectoris: Secondary | ICD-10-CM | POA: Diagnosis not present

## 2014-03-22 DIAGNOSIS — J189 Pneumonia, unspecified organism: Secondary | ICD-10-CM | POA: Diagnosis not present

## 2014-03-22 DIAGNOSIS — E119 Type 2 diabetes mellitus without complications: Secondary | ICD-10-CM | POA: Diagnosis not present

## 2014-03-22 DIAGNOSIS — J441 Chronic obstructive pulmonary disease with (acute) exacerbation: Secondary | ICD-10-CM | POA: Diagnosis not present

## 2014-03-22 DIAGNOSIS — F0390 Unspecified dementia without behavioral disturbance: Secondary | ICD-10-CM | POA: Diagnosis not present

## 2014-04-12 ENCOUNTER — Ambulatory Visit: Payer: Self-pay | Admitting: Family Medicine

## 2014-04-12 DIAGNOSIS — E119 Type 2 diabetes mellitus without complications: Secondary | ICD-10-CM | POA: Diagnosis not present

## 2014-04-12 DIAGNOSIS — Z111 Encounter for screening for respiratory tuberculosis: Secondary | ICD-10-CM | POA: Diagnosis not present

## 2014-04-12 DIAGNOSIS — J449 Chronic obstructive pulmonary disease, unspecified: Secondary | ICD-10-CM | POA: Diagnosis not present

## 2014-04-12 DIAGNOSIS — E875 Hyperkalemia: Secondary | ICD-10-CM | POA: Diagnosis not present

## 2014-04-12 DIAGNOSIS — Z1231 Encounter for screening mammogram for malignant neoplasm of breast: Secondary | ICD-10-CM | POA: Diagnosis not present

## 2014-04-14 DIAGNOSIS — E119 Type 2 diabetes mellitus without complications: Secondary | ICD-10-CM | POA: Diagnosis not present

## 2014-04-14 DIAGNOSIS — I2510 Atherosclerotic heart disease of native coronary artery without angina pectoris: Secondary | ICD-10-CM | POA: Diagnosis not present

## 2014-06-26 DIAGNOSIS — S72002A Fracture of unspecified part of neck of left femur, initial encounter for closed fracture: Secondary | ICD-10-CM | POA: Diagnosis not present

## 2014-06-26 DIAGNOSIS — E119 Type 2 diabetes mellitus without complications: Secondary | ICD-10-CM | POA: Diagnosis present

## 2014-06-26 DIAGNOSIS — S7292XA Unspecified fracture of left femur, initial encounter for closed fracture: Secondary | ICD-10-CM | POA: Diagnosis not present

## 2014-06-26 DIAGNOSIS — S7226XF Nondisplaced subtrochanteric fracture of unspecified femur, subsequent encounter for open fracture type IIIA, IIIB, or IIIC with routine healing: Secondary | ICD-10-CM | POA: Diagnosis not present

## 2014-06-26 DIAGNOSIS — Z7902 Long term (current) use of antithrombotics/antiplatelets: Secondary | ICD-10-CM | POA: Diagnosis not present

## 2014-06-26 DIAGNOSIS — R079 Chest pain, unspecified: Secondary | ICD-10-CM | POA: Diagnosis not present

## 2014-06-26 DIAGNOSIS — S72042A Displaced fracture of base of neck of left femur, initial encounter for closed fracture: Secondary | ICD-10-CM | POA: Diagnosis not present

## 2014-06-26 DIAGNOSIS — Z96642 Presence of left artificial hip joint: Secondary | ICD-10-CM | POA: Diagnosis not present

## 2014-06-26 DIAGNOSIS — Z471 Aftercare following joint replacement surgery: Secondary | ICD-10-CM | POA: Diagnosis not present

## 2014-06-26 DIAGNOSIS — Z01810 Encounter for preprocedural cardiovascular examination: Secondary | ICD-10-CM | POA: Diagnosis not present

## 2014-06-26 DIAGNOSIS — R279 Unspecified lack of coordination: Secondary | ICD-10-CM | POA: Diagnosis not present

## 2014-06-26 DIAGNOSIS — Z9181 History of falling: Secondary | ICD-10-CM | POA: Diagnosis not present

## 2014-06-26 DIAGNOSIS — Z794 Long term (current) use of insulin: Secondary | ICD-10-CM | POA: Diagnosis not present

## 2014-06-26 DIAGNOSIS — Z9981 Dependence on supplemental oxygen: Secondary | ICD-10-CM | POA: Diagnosis not present

## 2014-06-26 DIAGNOSIS — R52 Pain, unspecified: Secondary | ICD-10-CM | POA: Diagnosis not present

## 2014-06-26 DIAGNOSIS — I10 Essential (primary) hypertension: Secondary | ICD-10-CM | POA: Diagnosis present

## 2014-06-26 DIAGNOSIS — F0390 Unspecified dementia without behavioral disturbance: Secondary | ICD-10-CM | POA: Diagnosis not present

## 2014-06-26 DIAGNOSIS — E785 Hyperlipidemia, unspecified: Secondary | ICD-10-CM | POA: Diagnosis present

## 2014-06-26 DIAGNOSIS — I082 Rheumatic disorders of both aortic and tricuspid valves: Secondary | ICD-10-CM | POA: Diagnosis not present

## 2014-06-26 DIAGNOSIS — I739 Peripheral vascular disease, unspecified: Secondary | ICD-10-CM | POA: Diagnosis present

## 2014-06-26 DIAGNOSIS — S72012A Unspecified intracapsular fracture of left femur, initial encounter for closed fracture: Secondary | ICD-10-CM | POA: Diagnosis not present

## 2014-06-26 DIAGNOSIS — M6281 Muscle weakness (generalized): Secondary | ICD-10-CM | POA: Diagnosis not present

## 2014-06-26 DIAGNOSIS — S72052A Unspecified fracture of head of left femur, initial encounter for closed fracture: Secondary | ICD-10-CM | POA: Diagnosis not present

## 2014-06-26 DIAGNOSIS — I2510 Atherosclerotic heart disease of native coronary artery without angina pectoris: Secondary | ICD-10-CM | POA: Diagnosis present

## 2014-06-26 DIAGNOSIS — Z9889 Other specified postprocedural states: Secondary | ICD-10-CM | POA: Diagnosis not present

## 2014-06-26 DIAGNOSIS — M02852 Other reactive arthropathies, left hip: Secondary | ICD-10-CM | POA: Diagnosis not present

## 2014-06-26 DIAGNOSIS — Z955 Presence of coronary angioplasty implant and graft: Secondary | ICD-10-CM | POA: Diagnosis not present

## 2014-06-29 DIAGNOSIS — Z471 Aftercare following joint replacement surgery: Secondary | ICD-10-CM | POA: Diagnosis not present

## 2014-06-29 DIAGNOSIS — I082 Rheumatic disorders of both aortic and tricuspid valves: Secondary | ICD-10-CM | POA: Diagnosis not present

## 2014-06-29 DIAGNOSIS — S7292XA Unspecified fracture of left femur, initial encounter for closed fracture: Secondary | ICD-10-CM | POA: Diagnosis not present

## 2014-06-29 DIAGNOSIS — Z9181 History of falling: Secondary | ICD-10-CM | POA: Diagnosis not present

## 2014-06-29 DIAGNOSIS — I10 Essential (primary) hypertension: Secondary | ICD-10-CM | POA: Diagnosis not present

## 2014-06-29 DIAGNOSIS — I2510 Atherosclerotic heart disease of native coronary artery without angina pectoris: Secondary | ICD-10-CM | POA: Diagnosis not present

## 2014-06-29 DIAGNOSIS — S7226XF Nondisplaced subtrochanteric fracture of unspecified femur, subsequent encounter for open fracture type IIIA, IIIB, or IIIC with routine healing: Secondary | ICD-10-CM | POA: Diagnosis not present

## 2014-06-29 DIAGNOSIS — S72002A Fracture of unspecified part of neck of left femur, initial encounter for closed fracture: Secondary | ICD-10-CM | POA: Diagnosis not present

## 2014-06-29 DIAGNOSIS — Z01810 Encounter for preprocedural cardiovascular examination: Secondary | ICD-10-CM | POA: Diagnosis not present

## 2014-06-29 DIAGNOSIS — G301 Alzheimer's disease with late onset: Secondary | ICD-10-CM | POA: Diagnosis not present

## 2014-06-29 DIAGNOSIS — S72032A Displaced midcervical fracture of left femur, initial encounter for closed fracture: Secondary | ICD-10-CM | POA: Diagnosis not present

## 2014-06-29 DIAGNOSIS — Z7902 Long term (current) use of antithrombotics/antiplatelets: Secondary | ICD-10-CM | POA: Diagnosis not present

## 2014-06-29 DIAGNOSIS — E119 Type 2 diabetes mellitus without complications: Secondary | ICD-10-CM | POA: Diagnosis not present

## 2014-06-29 DIAGNOSIS — Z96642 Presence of left artificial hip joint: Secondary | ICD-10-CM | POA: Diagnosis not present

## 2014-06-29 DIAGNOSIS — R52 Pain, unspecified: Secondary | ICD-10-CM | POA: Diagnosis not present

## 2014-06-29 DIAGNOSIS — R609 Edema, unspecified: Secondary | ICD-10-CM | POA: Diagnosis not present

## 2014-06-29 DIAGNOSIS — R279 Unspecified lack of coordination: Secondary | ICD-10-CM | POA: Diagnosis not present

## 2014-06-29 DIAGNOSIS — R05 Cough: Secondary | ICD-10-CM | POA: Diagnosis not present

## 2014-06-29 DIAGNOSIS — Z9981 Dependence on supplemental oxygen: Secondary | ICD-10-CM | POA: Diagnosis not present

## 2014-06-29 DIAGNOSIS — Z794 Long term (current) use of insulin: Secondary | ICD-10-CM | POA: Diagnosis not present

## 2014-06-29 DIAGNOSIS — F0390 Unspecified dementia without behavioral disturbance: Secondary | ICD-10-CM | POA: Diagnosis not present

## 2014-06-29 DIAGNOSIS — E785 Hyperlipidemia, unspecified: Secondary | ICD-10-CM | POA: Diagnosis not present

## 2014-06-29 DIAGNOSIS — M6281 Muscle weakness (generalized): Secondary | ICD-10-CM | POA: Diagnosis not present

## 2014-06-29 DIAGNOSIS — Z9889 Other specified postprocedural states: Secondary | ICD-10-CM | POA: Diagnosis not present

## 2014-06-29 DIAGNOSIS — S72042A Displaced fracture of base of neck of left femur, initial encounter for closed fracture: Secondary | ICD-10-CM | POA: Diagnosis not present

## 2014-06-29 DIAGNOSIS — M02852 Other reactive arthropathies, left hip: Secondary | ICD-10-CM | POA: Diagnosis not present

## 2014-07-01 DIAGNOSIS — G301 Alzheimer's disease with late onset: Secondary | ICD-10-CM

## 2014-07-01 DIAGNOSIS — I739 Peripheral vascular disease, unspecified: Secondary | ICD-10-CM

## 2014-07-01 DIAGNOSIS — J441 Chronic obstructive pulmonary disease with (acute) exacerbation: Secondary | ICD-10-CM

## 2014-07-01 DIAGNOSIS — S72002A Fracture of unspecified part of neck of left femur, initial encounter for closed fracture: Secondary | ICD-10-CM

## 2014-07-01 DIAGNOSIS — I2510 Atherosclerotic heart disease of native coronary artery without angina pectoris: Secondary | ICD-10-CM

## 2014-07-01 DIAGNOSIS — E119 Type 2 diabetes mellitus without complications: Secondary | ICD-10-CM

## 2014-07-06 DIAGNOSIS — S72032A Displaced midcervical fracture of left femur, initial encounter for closed fracture: Secondary | ICD-10-CM | POA: Diagnosis not present

## 2014-07-19 DIAGNOSIS — S7292XA Unspecified fracture of left femur, initial encounter for closed fracture: Secondary | ICD-10-CM | POA: Diagnosis not present

## 2014-07-19 DIAGNOSIS — J449 Chronic obstructive pulmonary disease, unspecified: Secondary | ICD-10-CM

## 2014-07-19 DIAGNOSIS — I2510 Atherosclerotic heart disease of native coronary artery without angina pectoris: Secondary | ICD-10-CM | POA: Diagnosis not present

## 2014-07-19 DIAGNOSIS — R05 Cough: Secondary | ICD-10-CM | POA: Diagnosis not present

## 2014-07-19 DIAGNOSIS — G301 Alzheimer's disease with late onset: Secondary | ICD-10-CM | POA: Diagnosis not present

## 2014-07-23 DIAGNOSIS — R609 Edema, unspecified: Secondary | ICD-10-CM | POA: Diagnosis not present

## 2014-07-28 DIAGNOSIS — I739 Peripheral vascular disease, unspecified: Secondary | ICD-10-CM | POA: Diagnosis not present

## 2014-07-28 DIAGNOSIS — Z96642 Presence of left artificial hip joint: Secondary | ICD-10-CM | POA: Diagnosis not present

## 2014-07-28 DIAGNOSIS — Z794 Long term (current) use of insulin: Secondary | ICD-10-CM | POA: Diagnosis not present

## 2014-07-28 DIAGNOSIS — I10 Essential (primary) hypertension: Secondary | ICD-10-CM | POA: Diagnosis not present

## 2014-07-28 DIAGNOSIS — Z87891 Personal history of nicotine dependence: Secondary | ICD-10-CM | POA: Diagnosis not present

## 2014-07-28 DIAGNOSIS — F0390 Unspecified dementia without behavioral disturbance: Secondary | ICD-10-CM | POA: Diagnosis not present

## 2014-07-28 DIAGNOSIS — I2510 Atherosclerotic heart disease of native coronary artery without angina pectoris: Secondary | ICD-10-CM | POA: Diagnosis not present

## 2014-07-28 DIAGNOSIS — Z471 Aftercare following joint replacement surgery: Secondary | ICD-10-CM | POA: Diagnosis not present

## 2014-07-28 DIAGNOSIS — J449 Chronic obstructive pulmonary disease, unspecified: Secondary | ICD-10-CM | POA: Diagnosis not present

## 2014-07-28 DIAGNOSIS — Z9181 History of falling: Secondary | ICD-10-CM | POA: Diagnosis not present

## 2014-07-28 DIAGNOSIS — E119 Type 2 diabetes mellitus without complications: Secondary | ICD-10-CM | POA: Diagnosis not present

## 2014-08-02 ENCOUNTER — Encounter: Payer: Self-pay | Admitting: Internal Medicine

## 2014-08-02 DIAGNOSIS — F0280 Dementia in other diseases classified elsewhere without behavioral disturbance: Secondary | ICD-10-CM | POA: Insufficient documentation

## 2014-08-02 DIAGNOSIS — G309 Alzheimer's disease, unspecified: Secondary | ICD-10-CM

## 2014-08-03 DIAGNOSIS — E119 Type 2 diabetes mellitus without complications: Secondary | ICD-10-CM | POA: Diagnosis not present

## 2014-08-03 DIAGNOSIS — Z471 Aftercare following joint replacement surgery: Secondary | ICD-10-CM | POA: Diagnosis not present

## 2014-08-03 DIAGNOSIS — J449 Chronic obstructive pulmonary disease, unspecified: Secondary | ICD-10-CM | POA: Diagnosis not present

## 2014-08-03 DIAGNOSIS — F0390 Unspecified dementia without behavioral disturbance: Secondary | ICD-10-CM | POA: Diagnosis not present

## 2014-08-03 DIAGNOSIS — S72032D Displaced midcervical fracture of left femur, subsequent encounter for closed fracture with routine healing: Secondary | ICD-10-CM | POA: Diagnosis not present

## 2014-08-03 DIAGNOSIS — I739 Peripheral vascular disease, unspecified: Secondary | ICD-10-CM | POA: Diagnosis not present

## 2014-08-03 DIAGNOSIS — I2510 Atherosclerotic heart disease of native coronary artery without angina pectoris: Secondary | ICD-10-CM | POA: Diagnosis not present

## 2014-08-04 DIAGNOSIS — F0390 Unspecified dementia without behavioral disturbance: Secondary | ICD-10-CM | POA: Diagnosis not present

## 2014-08-04 DIAGNOSIS — Z471 Aftercare following joint replacement surgery: Secondary | ICD-10-CM | POA: Diagnosis not present

## 2014-08-04 DIAGNOSIS — I2510 Atherosclerotic heart disease of native coronary artery without angina pectoris: Secondary | ICD-10-CM | POA: Diagnosis not present

## 2014-08-04 DIAGNOSIS — E119 Type 2 diabetes mellitus without complications: Secondary | ICD-10-CM | POA: Diagnosis not present

## 2014-08-04 DIAGNOSIS — I739 Peripheral vascular disease, unspecified: Secondary | ICD-10-CM | POA: Diagnosis not present

## 2014-08-04 DIAGNOSIS — J449 Chronic obstructive pulmonary disease, unspecified: Secondary | ICD-10-CM | POA: Diagnosis not present

## 2014-08-05 DIAGNOSIS — E119 Type 2 diabetes mellitus without complications: Secondary | ICD-10-CM | POA: Diagnosis not present

## 2014-08-05 DIAGNOSIS — F0390 Unspecified dementia without behavioral disturbance: Secondary | ICD-10-CM | POA: Diagnosis not present

## 2014-08-05 DIAGNOSIS — I739 Peripheral vascular disease, unspecified: Secondary | ICD-10-CM | POA: Diagnosis not present

## 2014-08-05 DIAGNOSIS — Z471 Aftercare following joint replacement surgery: Secondary | ICD-10-CM | POA: Diagnosis not present

## 2014-08-05 DIAGNOSIS — I2510 Atherosclerotic heart disease of native coronary artery without angina pectoris: Secondary | ICD-10-CM | POA: Diagnosis not present

## 2014-08-05 DIAGNOSIS — J449 Chronic obstructive pulmonary disease, unspecified: Secondary | ICD-10-CM | POA: Diagnosis not present

## 2014-08-06 DIAGNOSIS — Z471 Aftercare following joint replacement surgery: Secondary | ICD-10-CM | POA: Diagnosis not present

## 2014-08-06 DIAGNOSIS — E119 Type 2 diabetes mellitus without complications: Secondary | ICD-10-CM | POA: Diagnosis not present

## 2014-08-06 DIAGNOSIS — J449 Chronic obstructive pulmonary disease, unspecified: Secondary | ICD-10-CM | POA: Diagnosis not present

## 2014-08-06 DIAGNOSIS — I739 Peripheral vascular disease, unspecified: Secondary | ICD-10-CM | POA: Diagnosis not present

## 2014-08-06 DIAGNOSIS — F0390 Unspecified dementia without behavioral disturbance: Secondary | ICD-10-CM | POA: Diagnosis not present

## 2014-08-06 DIAGNOSIS — I2510 Atherosclerotic heart disease of native coronary artery without angina pectoris: Secondary | ICD-10-CM | POA: Diagnosis not present

## 2014-08-09 DIAGNOSIS — F0390 Unspecified dementia without behavioral disturbance: Secondary | ICD-10-CM | POA: Diagnosis not present

## 2014-08-09 DIAGNOSIS — J449 Chronic obstructive pulmonary disease, unspecified: Secondary | ICD-10-CM | POA: Diagnosis not present

## 2014-08-09 DIAGNOSIS — I2510 Atherosclerotic heart disease of native coronary artery without angina pectoris: Secondary | ICD-10-CM | POA: Diagnosis not present

## 2014-08-09 DIAGNOSIS — I739 Peripheral vascular disease, unspecified: Secondary | ICD-10-CM | POA: Diagnosis not present

## 2014-08-09 DIAGNOSIS — Z471 Aftercare following joint replacement surgery: Secondary | ICD-10-CM | POA: Diagnosis not present

## 2014-08-09 DIAGNOSIS — E119 Type 2 diabetes mellitus without complications: Secondary | ICD-10-CM | POA: Diagnosis not present

## 2014-08-10 ENCOUNTER — Encounter: Payer: Self-pay | Admitting: Internal Medicine

## 2014-08-10 ENCOUNTER — Ambulatory Visit (INDEPENDENT_AMBULATORY_CARE_PROVIDER_SITE_OTHER): Payer: Medicare Other | Admitting: Internal Medicine

## 2014-08-10 VITALS — BP 138/78 | HR 60 | Temp 98.4°F | Ht 65.0 in | Wt 171.2 lb

## 2014-08-10 DIAGNOSIS — S72002A Fracture of unspecified part of neck of left femur, initial encounter for closed fracture: Secondary | ICD-10-CM | POA: Insufficient documentation

## 2014-08-10 DIAGNOSIS — S72002D Fracture of unspecified part of neck of left femur, subsequent encounter for closed fracture with routine healing: Secondary | ICD-10-CM | POA: Diagnosis not present

## 2014-08-10 DIAGNOSIS — E1151 Type 2 diabetes mellitus with diabetic peripheral angiopathy without gangrene: Secondary | ICD-10-CM | POA: Insufficient documentation

## 2014-08-10 DIAGNOSIS — E1159 Type 2 diabetes mellitus with other circulatory complications: Secondary | ICD-10-CM | POA: Diagnosis not present

## 2014-08-10 DIAGNOSIS — F0280 Dementia in other diseases classified elsewhere without behavioral disturbance: Secondary | ICD-10-CM

## 2014-08-10 DIAGNOSIS — I739 Peripheral vascular disease, unspecified: Secondary | ICD-10-CM | POA: Diagnosis not present

## 2014-08-10 DIAGNOSIS — I2510 Atherosclerotic heart disease of native coronary artery without angina pectoris: Secondary | ICD-10-CM

## 2014-08-10 DIAGNOSIS — G309 Alzheimer's disease, unspecified: Secondary | ICD-10-CM

## 2014-08-10 LAB — CBC WITH DIFFERENTIAL/PLATELET
Basophils Absolute: 0 10*3/uL (ref 0.0–0.1)
Basophils Relative: 0.3 % (ref 0.0–3.0)
EOS ABS: 0.3 10*3/uL (ref 0.0–0.7)
Eosinophils Relative: 2.7 % (ref 0.0–5.0)
HEMATOCRIT: 37 % (ref 36.0–46.0)
Hemoglobin: 12.3 g/dL (ref 12.0–15.0)
LYMPHS ABS: 3.4 10*3/uL (ref 0.7–4.0)
Lymphocytes Relative: 32 % (ref 12.0–46.0)
MCHC: 33.2 g/dL (ref 30.0–36.0)
MCV: 89.9 fl (ref 78.0–100.0)
Monocytes Absolute: 0.6 10*3/uL (ref 0.1–1.0)
Monocytes Relative: 6 % (ref 3.0–12.0)
Neutro Abs: 6.2 10*3/uL (ref 1.4–7.7)
Neutrophils Relative %: 59 % (ref 43.0–77.0)
Platelets: 299 10*3/uL (ref 150.0–400.0)
RBC: 4.12 Mil/uL (ref 3.87–5.11)
RDW: 15.5 % (ref 11.5–15.5)
WBC: 10.5 10*3/uL (ref 4.0–10.5)

## 2014-08-10 LAB — COMPREHENSIVE METABOLIC PANEL
ALK PHOS: 82 U/L (ref 39–117)
ALT: 14 U/L (ref 0–35)
AST: 18 U/L (ref 0–37)
Albumin: 3.9 g/dL (ref 3.5–5.2)
BILIRUBIN TOTAL: 0.3 mg/dL (ref 0.2–1.2)
BUN: 23 mg/dL (ref 6–23)
CO2: 31 meq/L (ref 19–32)
Calcium: 9.6 mg/dL (ref 8.4–10.5)
Chloride: 100 mEq/L (ref 96–112)
Creatinine, Ser: 0.96 mg/dL (ref 0.40–1.20)
GFR: 58.77 mL/min — ABNORMAL LOW (ref >60.00)
Glucose, Bld: 87 mg/dL (ref 70–99)
Potassium: 4.6 mEq/L (ref 3.5–5.1)
Sodium: 136 mEq/L (ref 135–145)
Total Protein: 7.2 g/dL (ref 6.0–8.3)

## 2014-08-10 LAB — LIPID PANEL
CHOL/HDL RATIO: 3
Cholesterol: 148 mg/dL (ref 0–200)
HDL: 50.5 mg/dL (ref >39.00)
LDL Cholesterol: 78 mg/dL (ref 0–99)
NONHDL: 97.5
Triglycerides: 97 mg/dL (ref 0.0–149.0)
VLDL: 19.4 mg/dL (ref 0.0–40.0)

## 2014-08-10 LAB — HM DIABETES FOOT EXAM

## 2014-08-10 LAB — T4, FREE: Free T4: 0.93 ng/dL (ref 0.60–1.60)

## 2014-08-10 LAB — HEMOGLOBIN A1C: Hgb A1c MFr Bld: 6.2 % (ref 4.6–6.5)

## 2014-08-10 NOTE — Progress Notes (Signed)
Subjective:    Patient ID: [name], female    DOB: [birth year], 81 y.o.   MRN: [id number]  HPI Here for follow up after rehab With daughter  Had left hip fracture 2/21 Repaired in [facility] (visiting her old home)--then rehab at [facility] Now back in daughter's home Home PT still Using rolling walker Hasn't really needed the oxycodone anymore Just getting tylenol  Stand by assist, assist with dressing still Back to bathroom on her own. Mostly continent--- diaper at night for urine Some troubles sleeping  Stopped the [place] after the fracture---plans to restart this over time  Checks sugars most mornings 91 this AM-- 81 was the lowest, but no symptoms. Usually under 120 No sores or pain in feet Some mild swelling still Raised area on back of right calf  No chest pain No SOB No dizziness or syncope  Current Outpatient Prescriptions on File Prior to Visit  Medication Sig Dispense Refill  . aspirin 81 MG tablet Take 81 mg by mouth daily.     . clopidogrel (PLAVIX) 75 MG tablet Take 75 mg by mouth daily.     Marland Kitchen donepezil (ARICEPT) 10 MG tablet Take 10 mg by mouth at bedtime.     . metoprolol tartrate (LOPRESSOR) 25 MG tablet Take 25 mg by mouth 2 (two) times daily.     . simvastatin (ZOCOR) 40 MG tablet Take 40 mg by mouth daily.     . cetirizine (ZYRTEC) 10 MG tablet Take 10 mg by mouth daily.      No current facility-administered medications on file prior to visit.    No Known Allergies  Past Medical History  Diagnosis Date  . Alzheimer's dementia without behavioral disturbance   . Coronary atherosclerosis of unspecified type of vessel, native or graft   . Allergic rhinitis, cause unspecified   . MI (myocardial infarction)   . Diabetes mellitus without complication   . COPD (chronic obstructive pulmonary disease)   . Carotid stenosis   . PAD (peripheral artery disease)   . Other and unspecified hyperlipidemia   . Pneumonia 2012 & 10/15    Past Surgical History    Procedure Laterality Date  . Cardiac catheterization      [place] x1 stent  . Stent in lower extremity (?femoral)      Family History  Problem Relation Age of Onset  . Heart disease Mother   . Diabetes Father   . COPD Father   . Cancer Brother     History   Social History  . Marital Status: Widowed    Spouse Name: N/A  . Number of Children: 3  . Years of Education: N/A   Occupational History  . Mill worker--retired    Social History Main Topics  . Smoking status: Former Smoker -- 60 years    Types: Cigarettes  . Smokeless tobacco: Never Used  . Alcohol Use: No  . Drug Use: No  . Sexual Activity: Not on file   Other Topics Concern  . Not on file   Social History Narrative   Not sure about living will   Daughter [name] has health care POA   Has DNR already done   No prior wishes known about tube feeding   Review of Systems Appetite is good Weight is stable Some sleep issues at times    Objective:   Physical Exam  Constitutional: She appears well-nourished.  Neck: Normal range of motion. Neck supple. No thyromegaly present.  Cardiovascular: Normal  rate, regular rhythm and normal heart sounds.  Exam reveals no gallop.   No murmur heard. Faint pedal pulses  Pulmonary/Chest: Effort normal and breath sounds normal. No respiratory distress. She has no wheezes. She has no rales.  Abdominal: Soft. There is no tenderness.  Musculoskeletal:  Trace to 1+ pedal edema  Lymphadenopathy:    She has no cervical adenopathy.  Neurological:  Normal interaction--does repeat herself at times  Normal fine touch sensation on plantar feet  Skin:  No foot lesions Pink lesion on posterior right calf (advised derm eval)  Psychiatric: She has a normal mood and affect. Her behavior is normal.          Assessment & Plan:

## 2014-08-10 NOTE — Assessment & Plan Note (Signed)
Still mild Doing well with daughter Daughter has help coming in in AMs for now

## 2014-08-10 NOTE — Assessment & Plan Note (Signed)
Has transitioned back to daughter's home Still getting home PT Tylenol for pain

## 2014-08-10 NOTE — Assessment & Plan Note (Signed)
Seems to have good control Due for labs Daughter will set up eye exam

## 2014-08-10 NOTE — Patient Instructions (Signed)
Please try to avoid tylenol PM. You can try over the counter melatonin at bedtime to help sleep (like 3-5mg  at most).

## 2014-08-10 NOTE — Progress Notes (Signed)
Pre visit review using our clinic review tool, if applicable. No additional management support is needed unless otherwise documented below in the visit note. 

## 2014-08-10 NOTE — Assessment & Plan Note (Signed)
Stents seem to still be patent since faint pulses palpable ASA and statin

## 2014-08-10 NOTE — Assessment & Plan Note (Signed)
No acute symptoms

## 2014-08-11 ENCOUNTER — Encounter: Payer: Self-pay | Admitting: *Deleted

## 2014-08-11 DIAGNOSIS — I739 Peripheral vascular disease, unspecified: Secondary | ICD-10-CM | POA: Diagnosis not present

## 2014-08-11 DIAGNOSIS — J449 Chronic obstructive pulmonary disease, unspecified: Secondary | ICD-10-CM | POA: Diagnosis not present

## 2014-08-11 DIAGNOSIS — E119 Type 2 diabetes mellitus without complications: Secondary | ICD-10-CM | POA: Diagnosis not present

## 2014-08-11 DIAGNOSIS — I2510 Atherosclerotic heart disease of native coronary artery without angina pectoris: Secondary | ICD-10-CM | POA: Diagnosis not present

## 2014-08-11 DIAGNOSIS — Z471 Aftercare following joint replacement surgery: Secondary | ICD-10-CM | POA: Diagnosis not present

## 2014-08-11 DIAGNOSIS — F0390 Unspecified dementia without behavioral disturbance: Secondary | ICD-10-CM | POA: Diagnosis not present

## 2014-08-13 DIAGNOSIS — I739 Peripheral vascular disease, unspecified: Secondary | ICD-10-CM | POA: Diagnosis not present

## 2014-08-13 DIAGNOSIS — F0390 Unspecified dementia without behavioral disturbance: Secondary | ICD-10-CM | POA: Diagnosis not present

## 2014-08-13 DIAGNOSIS — Z471 Aftercare following joint replacement surgery: Secondary | ICD-10-CM | POA: Diagnosis not present

## 2014-08-13 DIAGNOSIS — I2510 Atherosclerotic heart disease of native coronary artery without angina pectoris: Secondary | ICD-10-CM | POA: Diagnosis not present

## 2014-08-13 DIAGNOSIS — J449 Chronic obstructive pulmonary disease, unspecified: Secondary | ICD-10-CM | POA: Diagnosis not present

## 2014-08-13 DIAGNOSIS — E119 Type 2 diabetes mellitus without complications: Secondary | ICD-10-CM | POA: Diagnosis not present

## 2014-08-16 DIAGNOSIS — F0390 Unspecified dementia without behavioral disturbance: Secondary | ICD-10-CM | POA: Diagnosis not present

## 2014-08-16 DIAGNOSIS — I2510 Atherosclerotic heart disease of native coronary artery without angina pectoris: Secondary | ICD-10-CM | POA: Diagnosis not present

## 2014-08-16 DIAGNOSIS — E119 Type 2 diabetes mellitus without complications: Secondary | ICD-10-CM | POA: Diagnosis not present

## 2014-08-16 DIAGNOSIS — I739 Peripheral vascular disease, unspecified: Secondary | ICD-10-CM | POA: Diagnosis not present

## 2014-08-16 DIAGNOSIS — J449 Chronic obstructive pulmonary disease, unspecified: Secondary | ICD-10-CM | POA: Diagnosis not present

## 2014-08-16 DIAGNOSIS — Z471 Aftercare following joint replacement surgery: Secondary | ICD-10-CM | POA: Diagnosis not present

## 2014-08-18 DIAGNOSIS — I739 Peripheral vascular disease, unspecified: Secondary | ICD-10-CM | POA: Diagnosis not present

## 2014-08-18 DIAGNOSIS — J449 Chronic obstructive pulmonary disease, unspecified: Secondary | ICD-10-CM | POA: Diagnosis not present

## 2014-08-18 DIAGNOSIS — I2510 Atherosclerotic heart disease of native coronary artery without angina pectoris: Secondary | ICD-10-CM | POA: Diagnosis not present

## 2014-08-18 DIAGNOSIS — E119 Type 2 diabetes mellitus without complications: Secondary | ICD-10-CM | POA: Diagnosis not present

## 2014-08-18 DIAGNOSIS — Z471 Aftercare following joint replacement surgery: Secondary | ICD-10-CM | POA: Diagnosis not present

## 2014-08-18 DIAGNOSIS — F0390 Unspecified dementia without behavioral disturbance: Secondary | ICD-10-CM | POA: Diagnosis not present

## 2014-08-19 ENCOUNTER — Ambulatory Visit (INDEPENDENT_AMBULATORY_CARE_PROVIDER_SITE_OTHER): Payer: Medicare Other | Admitting: Podiatry

## 2014-08-19 ENCOUNTER — Encounter: Payer: Self-pay | Admitting: Podiatry

## 2014-08-19 VITALS — BP 159/78 | HR 70 | Resp 16 | Ht 64.0 in | Wt 175.0 lb

## 2014-08-19 DIAGNOSIS — M79676 Pain in unspecified toe(s): Secondary | ICD-10-CM | POA: Diagnosis not present

## 2014-08-19 DIAGNOSIS — I739 Peripheral vascular disease, unspecified: Secondary | ICD-10-CM | POA: Diagnosis not present

## 2014-08-19 DIAGNOSIS — I2510 Atherosclerotic heart disease of native coronary artery without angina pectoris: Secondary | ICD-10-CM

## 2014-08-19 DIAGNOSIS — E119 Type 2 diabetes mellitus without complications: Secondary | ICD-10-CM | POA: Diagnosis not present

## 2014-08-19 DIAGNOSIS — B351 Tinea unguium: Secondary | ICD-10-CM | POA: Diagnosis not present

## 2014-08-19 DIAGNOSIS — F0390 Unspecified dementia without behavioral disturbance: Secondary | ICD-10-CM | POA: Diagnosis not present

## 2014-08-19 DIAGNOSIS — J449 Chronic obstructive pulmonary disease, unspecified: Secondary | ICD-10-CM | POA: Diagnosis not present

## 2014-08-19 DIAGNOSIS — Z471 Aftercare following joint replacement surgery: Secondary | ICD-10-CM | POA: Diagnosis not present

## 2014-08-19 NOTE — Progress Notes (Signed)
   Subjective:    Patient ID: [name], female    DOB: 09/06/1927, 81 y.o.   MRN: [id number]  HPI 81 year old female presents the office today with her daughter with complaints of painful, elongated toenails which they are unable to trim himself. Denies any redness or drainage on the nail sites. She states she has diabetic and her blood sugar last checked was 108. No other complaints at this time.   Review of Systems  HENT: Positive for hearing loss.   Musculoskeletal: Positive for gait problem.  Psychiatric/Behavioral: Positive for confusion.  All other systems reviewed and are negative.      Objective:   Physical Exam Awake, alert, NAD DP/PT pulses palpable, CRT less than 3 seconds  Protective sensation appears to be decreased with [name] monofilament, Achilles tendon reflex intact. Nails are hypertrophic, dystrophic, elongated, brittle, discolored 10. There is no swelling erythema or drainage along the nail sites. There is tenderness palpation overlying nails 1-5 bilaterally. No open lesions or pre-ulcer lesions identified No other areas of tenderness to bilateral lower extremities. No pain with calf compression, swelling, warmth, erythema.      Assessment & Plan:  81 year old female with symptomatic onychomycosis -Treatment options were discussed the patient including alternatives, risks, complications. -Nail sharply debrided 10 without complication/bleeding. -Discussed the importance of daily foot inspection -Follow-up in 3 months or sooner if any problems are to arise. In the meantime encouraged to call the office with any questions or concerns.

## 2014-08-24 DIAGNOSIS — Z471 Aftercare following joint replacement surgery: Secondary | ICD-10-CM | POA: Diagnosis not present

## 2014-08-24 DIAGNOSIS — E119 Type 2 diabetes mellitus without complications: Secondary | ICD-10-CM | POA: Diagnosis not present

## 2014-08-24 DIAGNOSIS — I2510 Atherosclerotic heart disease of native coronary artery without angina pectoris: Secondary | ICD-10-CM | POA: Diagnosis not present

## 2014-08-24 DIAGNOSIS — F0390 Unspecified dementia without behavioral disturbance: Secondary | ICD-10-CM | POA: Diagnosis not present

## 2014-08-24 DIAGNOSIS — J449 Chronic obstructive pulmonary disease, unspecified: Secondary | ICD-10-CM | POA: Diagnosis not present

## 2014-08-24 DIAGNOSIS — I739 Peripheral vascular disease, unspecified: Secondary | ICD-10-CM | POA: Diagnosis not present

## 2014-08-25 DIAGNOSIS — J449 Chronic obstructive pulmonary disease, unspecified: Secondary | ICD-10-CM | POA: Diagnosis not present

## 2014-08-25 DIAGNOSIS — I2510 Atherosclerotic heart disease of native coronary artery without angina pectoris: Secondary | ICD-10-CM | POA: Diagnosis not present

## 2014-08-25 DIAGNOSIS — E119 Type 2 diabetes mellitus without complications: Secondary | ICD-10-CM | POA: Diagnosis not present

## 2014-08-25 DIAGNOSIS — I739 Peripheral vascular disease, unspecified: Secondary | ICD-10-CM | POA: Diagnosis not present

## 2014-08-25 DIAGNOSIS — Z471 Aftercare following joint replacement surgery: Secondary | ICD-10-CM | POA: Diagnosis not present

## 2014-08-25 DIAGNOSIS — F0390 Unspecified dementia without behavioral disturbance: Secondary | ICD-10-CM | POA: Diagnosis not present

## 2014-08-26 DIAGNOSIS — I739 Peripheral vascular disease, unspecified: Secondary | ICD-10-CM | POA: Diagnosis not present

## 2014-08-26 DIAGNOSIS — E119 Type 2 diabetes mellitus without complications: Secondary | ICD-10-CM | POA: Diagnosis not present

## 2014-08-26 DIAGNOSIS — J449 Chronic obstructive pulmonary disease, unspecified: Secondary | ICD-10-CM | POA: Diagnosis not present

## 2014-08-26 DIAGNOSIS — I2510 Atherosclerotic heart disease of native coronary artery without angina pectoris: Secondary | ICD-10-CM | POA: Diagnosis not present

## 2014-08-26 DIAGNOSIS — Z471 Aftercare following joint replacement surgery: Secondary | ICD-10-CM | POA: Diagnosis not present

## 2014-08-26 DIAGNOSIS — F0390 Unspecified dementia without behavioral disturbance: Secondary | ICD-10-CM | POA: Diagnosis not present

## 2014-08-28 NOTE — Discharge Summary (Signed)
PATIENT NAMEBELLAROSE, [name] MR#:  [id number] DATE OF BIRTH:  13-Dec-1927  DATE OF ADMISSION:  02/04/2014 DATE OF DISCHARGE:  02/11/2014  DISCHARGE DIAGNOSES:  1.  Acute hypoxic respiratory failure secondary to community-acquired pneumonia.  2.  Chronic obstructive pulmonary disease exacerbation.  3.  Acute renal failure secondary to pneumonia and chronic obstructive pulmonary disease exacerbation.  4.  Hypertension.  5.  Coronary artery disease.  6.  Type 2 diabetes mellitus. 7.  Hyperglycemia secondary to steroids. The patient's diabetes is newly diagnosed.  The patient is given a prescription for a glucometer with test strips. We also gave education on both Lantus injections and checking blood sugars. I have spoken with advanced home care nurse, Britta Mccreedy, and explained that she needs sugar monitoring, home health nurse visits to help her with sugar checkups and adjusting insulin.  DISCHARGE MEDICATIONS: 1.  Aspirin 81 mg daily. 2.  Cetirizine 10 mg daily.  3.  Plavix 75 mg p.o. daily.  4.  Aricept 10 mg p.o. daily. 5.  Metoprolol 25 mg p.o. b.i.d.  6.  Simvastatin 40 mg daily.  7.  Lantus 15 units at bedtime. This has to be adjusted according to the blood sugar response. The patient is on steroids and I told the healthcare nurse to decrease the Lantus according to sliding scale. 8.  Spiriva 18 mcg inhalation daily.  9.  Levaquin 750 mg q. 48 hours.  10.  Robitussin 15 mL every 6 hours.  11.  Prednisone 20 mg 3 tablets daily for 3 days, 2 tablets daily for 3 days, 1 tablet daily for 3 days and then stop.  12.  ProAir 90 mcg 2 puffs 4 times daily.  The patient's Spiriva, Advair, prednisone, Lantus are new medications. The patient discharged home with physical therapy and RN. An RN is to educate about diabetes and diabetic treatment. Discharged home with 2 L of oxygen.   CODE STATUS: Full code.  ANTIBIOTICS: Levaquin is given for 7 more days.   CONSULTATIONS: Physical therapy consult  and also inpatient diabetic nurse consult.   HOSPITAL COURSE: The patient is an 81 year old female patient with tobacco abuse history who comes in because of fever, chills, cough for 2 days. Look at history and physical for full details. Chest x-ray on admission showed pneumonia. She was admitted for pneumonia, which is community-acquired pneumonia; started on Levaquin; and the patient's sputum cultures, blood cultures were sent. The patient continued to have severe cough and wheezing. Started on steroids over the weekend, along with nebulizers. The patient's sputum culture did not show any growth, and we did a CT of the chest to evaluate for possible lung mass. CT, chest, negative for any lung cancer, but did show a spot on the right lung and that nodule needs evaluation with repeat CT scan for 1 year. I have discussed the findings with the patient's daughter and she needs to repeat a CT scan in 1 year. Patient's CT scan showed emphysema and also 1.3 cm soft tissue density in the left breast and also has ground-glass opacities, largest in the peripheral right upper lobe. The patient also has a 4 mm right upper lobe pulmonary nodule. The patient's symptoms nicely improved. We increased the Levaquin to 750 mg daily for more effective coverage of her pneumonia. Discharged home with home oxygen, antibiotics, tapering course of prednisone. The patient's symptoms improved, although she did have some cough. I had to add Advair and Spiriva to her medications. I discussed the plan with the  patient's daughter. I have discussed the findings of repeat chest x-ray with the patient's daughter and told her that she will need a mammogram, but due to her advanced age the patient's family will not consider that, though we have discussed the option of mammogram for her breast mass and also repeat CT chest in a year for the right lung nodule. The patient's oxygen saturation was 81% at rest and with exertion, 92%. The patient  qualified for 2 L of oxygen. Discharged home with home health, PT, and RN. 2.  Type 2 diabetes mellitus. Recently diagnosed with diabetes as an outpatient. Not started on medications. Here sugars have been in 300s secondary to diabetes, infection, and also her steroids. Started on Lantus and sliding scale coverage. I have discussed with the home health nurse to check on her sugars, adjust Lantus, and educate her about insulin injections. The patient can follow up with her primary doctor regarding medication options.  3.  Coronary artery disease. The patient has a history of coronary artery disease. Continued on aspirin, beta blockers, aspirin, Plavix, and also beta blockers and statins.  4.  Dementia. The patient does have dementia. The patient lives with her daughter.   TIME SPENT: More than 30 minutes.    ____________________________ Katha HammingSnehalatha Leisel Pinette, MD sk:ST D: 02/13/2014 21:38:19 ET T: 02/13/2014 23:55:58 ET JOB#: [id number]  cc: Katha HammingSnehalatha Clarity Ciszek, MD, <Dictator> Katha HammingSNEHALATHA Mercy Malena MD ELECTRONICALLY SIGNED 02/27/2014 18:13

## 2014-08-28 NOTE — H&P (Signed)
PATIENT NAMERIANNON, [name] MR#:  [id number] DATE OF BIRTH:  12-29-1927  DATE OF ADMISSION:  02/04/2014  PRIMARY CARE PHYSICIAN:  Nonlocal.    REFERRING PHYSICIAN:  Dr. Carollee Massed.   CHIEF COMPLAINT: Fever, chills, cough 2 days.   HISTORY OF PRESENT ILLNESS: An 81 year old Caucasian female with a history of COPD, CAD, diabetes, Alzheimer disease, was sent to ED from home due to fever, shortness of breath, and cough. Actually the patient got a cold for the past weak and then developed a fever, chills, and cough and shortness of breath for the past 2 days, but the patient denies any other symptoms. The patient had chest x-ray that showed consolidation and the patient was treated with antibiotics in ED.   PAST MEDICAL HISTORY: COPD, CAD, MI, Alzheimer disease.   PAST SURGICAL HISTORY:  PVD with the stent placement in the right leg, cardiac stent.   SOCIAL HISTORY: No smoking or drinking or illicit drugs.   FAMILY HISTORY: Diabetes, CVA.    REVIEW OF SYSTEMS:   CONSTITUTIONAL: The patient has a fever and chills. No headache or dizziness, but has weakness.  EYES: No double vision or blurred vision.   EARS, NOSE, AND THROAT: No postnasal drip, slurred speech, or dysphagia.  CARDIOVASCULAR: No chest pain, palpitation, orthopnea, nocturnal dyspnea. No leg edema. PULMONARY: Positive for cough, sputum, shortness of breath, but no hemoptysis.  GASTROINTESTINAL: No abdominal pain, nausea, vomiting, diarrhea. No melena or bloody stool.  GENITOURINARY:  No dysuria, hematuria, or incontinence.  SKIN: No rash or jaundice.  NEUROLOGIC: No syncope, loss of consciousness, or seizure.  ENDOCRINOLOGY: No polyuria, polydipsia, heat or cold intolerance. HEMATOLOGIC: No easy bruising or bleeding.    ALLERGIES: No.   HOME MEDICATIONS: Zocor 40 mg p.o. daily, Lopressor 25 mg p.o. b.i.d., donepezil 10 mg p.o. daily at bedtime, Plavix 75 mg p.o. daily, cetirizine 10 mg p.o. daily, aspirin 81 mg p.o. daily.    PHYSICAL EXAMINATION:  VITAL SIGNS: Temperature 101.4, blood pressure 169/78, pulse 106, oxygen saturation 92% on 2 liters of oxygen.  GENERAL: The patient is alert, awake, in no acute distress.  HEENT: Pupils round, equally reactive to light and accommodation. Moist oral mucosa. Clear oropharynx.  NECK: Supple.  No JVD or carotid bruit. No lymphadenopathy. No thyromegaly.  CARDIOVASCULAR: S1, S2, regular rate and rhythm. No murmurs, gallops.  PULMONARY: Bilateral air entry. Bilateral crackles. No use of accessory muscles to breathe.  ABDOMEN: Soft. No distention or tenderness. No organomegaly. Bowel sounds present.  EXTREMITIES: No edema, clubbing, or cyanosis. No calf tenderness. Bilateral pedal pulses present.  NEUROLOGIC: The patient is alert, awake, but a little bit confused, follows commands. No focal deficit.  Power 4 out of 5. Sensation intact.   LABORATORY DATA:  1.  Urinalysis is negative.  2.  Chest x-ray shows airspace consolidation in the peripheral of the right upper lobe as well as to a lesser extension in the right base.  3.  EKG showed normal sinus rhythm at 90 BPM.  4.  Glucose 283, BUN 23, creatinine 1.22, sodium 135, potassium 4.4, chloride 99, bicarbonate 27. WBC 22.6, hemoglobin 13.8, platelet 271,000. Troponin 0.04.   IMPRESSIONS:  1.  Pneumonia, possible community-acquired pneumonia.  2.  Dehydration.  3.  Diabetes.   4.  Chronic obstructive pulmonary disease.  5.  Peripheral vascular disease. 6.  Coronary artery disease.   PLAN OF TREATMENT:  1.  The patient will be admitted to medical floor. The patient was treated with Levaquin in  the ED,  we will continue Levaquin. Follow up sputum culture, blood culture.    2.  We will continue Plavix, aspirin, and statin for CAD.   3.  For diabetes we will start sliding scale, check hemoglobin A1c.   4.  For hypertension continue Lopressor.  5.  I discussed the patient's condition and plan of treatment with the patient  and the patient's daughter.  The patient's daughter says that her sister is power of attorney, she will discuss with her, so the patient's code status is full code at this time and she will make a decision.   TIME SPENT: About 65 minutes.    ____________________________ Shaune PollackQing Heyden Jaber, MD qc:bu D: 02/04/2014 21:01:43 ET T: 02/04/2014 21:27:12 ET JOB#: [id number]  cc: Shaune PollackQing Carmita Boom, MD, <Dictator> Shaune PollackQING Tylen Leverich MD ELECTRONICALLY SIGNED 02/05/2014 15:37

## 2014-08-30 DIAGNOSIS — L820 Inflamed seborrheic keratosis: Secondary | ICD-10-CM | POA: Diagnosis not present

## 2014-08-30 DIAGNOSIS — L821 Other seborrheic keratosis: Secondary | ICD-10-CM | POA: Diagnosis not present

## 2014-08-31 DIAGNOSIS — I2510 Atherosclerotic heart disease of native coronary artery without angina pectoris: Secondary | ICD-10-CM | POA: Diagnosis not present

## 2014-08-31 DIAGNOSIS — I739 Peripheral vascular disease, unspecified: Secondary | ICD-10-CM | POA: Diagnosis not present

## 2014-08-31 DIAGNOSIS — F0390 Unspecified dementia without behavioral disturbance: Secondary | ICD-10-CM | POA: Diagnosis not present

## 2014-08-31 DIAGNOSIS — Z471 Aftercare following joint replacement surgery: Secondary | ICD-10-CM | POA: Diagnosis not present

## 2014-08-31 DIAGNOSIS — J449 Chronic obstructive pulmonary disease, unspecified: Secondary | ICD-10-CM | POA: Diagnosis not present

## 2014-08-31 DIAGNOSIS — E119 Type 2 diabetes mellitus without complications: Secondary | ICD-10-CM | POA: Diagnosis not present

## 2014-09-02 DIAGNOSIS — I739 Peripheral vascular disease, unspecified: Secondary | ICD-10-CM | POA: Diagnosis not present

## 2014-09-02 DIAGNOSIS — Z471 Aftercare following joint replacement surgery: Secondary | ICD-10-CM | POA: Diagnosis not present

## 2014-09-02 DIAGNOSIS — E119 Type 2 diabetes mellitus without complications: Secondary | ICD-10-CM | POA: Diagnosis not present

## 2014-09-02 DIAGNOSIS — J449 Chronic obstructive pulmonary disease, unspecified: Secondary | ICD-10-CM | POA: Diagnosis not present

## 2014-09-02 DIAGNOSIS — F0390 Unspecified dementia without behavioral disturbance: Secondary | ICD-10-CM | POA: Diagnosis not present

## 2014-09-02 DIAGNOSIS — I2510 Atherosclerotic heart disease of native coronary artery without angina pectoris: Secondary | ICD-10-CM | POA: Diagnosis not present

## 2014-09-08 DIAGNOSIS — J449 Chronic obstructive pulmonary disease, unspecified: Secondary | ICD-10-CM | POA: Diagnosis not present

## 2014-09-08 DIAGNOSIS — I2510 Atherosclerotic heart disease of native coronary artery without angina pectoris: Secondary | ICD-10-CM | POA: Diagnosis not present

## 2014-09-08 DIAGNOSIS — I739 Peripheral vascular disease, unspecified: Secondary | ICD-10-CM | POA: Diagnosis not present

## 2014-09-08 DIAGNOSIS — E119 Type 2 diabetes mellitus without complications: Secondary | ICD-10-CM | POA: Diagnosis not present

## 2014-09-08 DIAGNOSIS — Z471 Aftercare following joint replacement surgery: Secondary | ICD-10-CM | POA: Diagnosis not present

## 2014-09-08 DIAGNOSIS — F0390 Unspecified dementia without behavioral disturbance: Secondary | ICD-10-CM | POA: Diagnosis not present

## 2014-09-15 ENCOUNTER — Telehealth: Payer: Self-pay

## 2014-09-15 MED ORDER — "PEN NEEDLES 5/16"" 31G X 8 MM MISC": Status: DC

## 2014-09-15 MED ORDER — INSULIN GLARGINE 100 UNIT/ML SOLOSTAR PEN: 25.0000 [IU] | PEN_INJECTOR | Freq: Every day | SUBCUTANEOUS | Status: DC

## 2014-09-15 NOTE — Telephone Encounter (Signed)
Pt just needed a refill rx sent to pharmacy by e-script

## 2014-09-15 NOTE — Telephone Encounter (Signed)
[name] pts daughter left v/m returning call about express script refill.

## 2014-09-20 DIAGNOSIS — J449 Chronic obstructive pulmonary disease, unspecified: Secondary | ICD-10-CM | POA: Diagnosis not present

## 2014-09-20 DIAGNOSIS — I739 Peripheral vascular disease, unspecified: Secondary | ICD-10-CM | POA: Diagnosis not present

## 2014-09-20 DIAGNOSIS — Z471 Aftercare following joint replacement surgery: Secondary | ICD-10-CM | POA: Diagnosis not present

## 2014-09-20 DIAGNOSIS — I2510 Atherosclerotic heart disease of native coronary artery without angina pectoris: Secondary | ICD-10-CM | POA: Diagnosis not present

## 2014-09-20 DIAGNOSIS — F0390 Unspecified dementia without behavioral disturbance: Secondary | ICD-10-CM | POA: Diagnosis not present

## 2014-09-20 DIAGNOSIS — E119 Type 2 diabetes mellitus without complications: Secondary | ICD-10-CM | POA: Diagnosis not present

## 2014-09-21 ENCOUNTER — Other Ambulatory Visit: Payer: Self-pay | Admitting: Internal Medicine

## 2014-09-22 ENCOUNTER — Telehealth: Payer: Self-pay | Admitting: Internal Medicine

## 2014-09-22 NOTE — Telephone Encounter (Signed)
PLEASE NOTE: All timestamps contained within this report are represented as [place] Standard Time. CONFIDENTIALTY NOTICE: This fax transmission is intended only for the addressee. It contains information that is legally privileged, confidential or otherwise protected from use or disclosure. If you are not the intended recipient, you are strictly prohibited from reviewing, disclosing, copying using or disseminating any of this information or taking any action in reliance on or regarding this information. If you have received this fax in error, please notify us immediately by telephone so that we can arrange for its return to us. Phone: [phone], Toll-Free: [phone], Fax: [phone] Page: 1 of 1 Call Id: [id number] [facility] - Client TELEPHONE ADVICE RECORD [facility] Patient Name: [name] DOB: 03/07/1928 Initial Comment Caller states her mother has redness under eye, does not remember hitting anything Nurse Assessment Nurse: Elijah Birkaldwell, RN, Stark BrayLynda Date/Time (Eastern Time): 09/22/2014 2:24:47 PM Confirm and document reason for call. If symptomatic, describe symptoms. ---Caller states her mother has redness under her left eye, does not remember hitting anything. Noticed it yesterday. Is on Clopidogrel & ASA. It does not bother her. Has the patient traveled out of the country within the last 30 days? ---Not Applicable Does the patient require triage? ---Yes Related visit to physician within the last 2 weeks? ---No Does the PT have any chronic conditions? (i.e. diabetes, asthma, etc.) ---Yes List chronic conditions. ---recent hip surgery, dementia, diabetic, heart stent, leg stent Guidelines Guideline Title Affirmed Question Affirmed Notes Rash or Redness - Localized Mild localized rash (all triage questions negative) Final Disposition User Home Care [place], RN, Lynda Comments Caller states the red mark looks like a "NIKE" swish  mark. It does not bother her at all.

## 2014-09-22 NOTE — Telephone Encounter (Signed)
Spoke with daughter and advised results. Patient had hip surgery

## 2014-09-22 NOTE — Telephone Encounter (Signed)
If it is not inflamed, or tender--they can probably just keep an eye on it Is the "swish" by where the surgery was?

## 2014-10-20 ENCOUNTER — Ambulatory Visit: Payer: PRIVATE HEALTH INSURANCE | Admitting: Internal Medicine

## 2014-11-03 DIAGNOSIS — S72032D Displaced midcervical fracture of left femur, subsequent encounter for closed fracture with routine healing: Secondary | ICD-10-CM | POA: Diagnosis not present

## 2014-11-10 ENCOUNTER — Ambulatory Visit (INDEPENDENT_AMBULATORY_CARE_PROVIDER_SITE_OTHER): Payer: Medicare Other | Admitting: Podiatry

## 2014-11-10 DIAGNOSIS — M79676 Pain in unspecified toe(s): Secondary | ICD-10-CM

## 2014-11-10 DIAGNOSIS — M79673 Pain in unspecified foot: Secondary | ICD-10-CM

## 2014-11-10 DIAGNOSIS — E1159 Type 2 diabetes mellitus with other circulatory complications: Secondary | ICD-10-CM | POA: Diagnosis not present

## 2014-11-10 DIAGNOSIS — E1151 Type 2 diabetes mellitus with diabetic peripheral angiopathy without gangrene: Secondary | ICD-10-CM

## 2014-11-10 DIAGNOSIS — B351 Tinea unguium: Secondary | ICD-10-CM | POA: Diagnosis not present

## 2014-11-10 NOTE — Progress Notes (Signed)
Patient presents to the office today with a chief complaint of painful elongated toenails.  Objective: Pulses are palpable bilateral. Nails are thick yellow dystrophic clinically mycotic and painful palpation.  Assessment: Pain in limb secondary to onychomycosis 1 through 5 bilateral.  Plan: Debridement of nails 1 through 5 bilateral covered service secondary to pain.  

## 2014-11-18 ENCOUNTER — Ambulatory Visit: Payer: Medicare Other | Admitting: Podiatry

## 2014-11-21 DIAGNOSIS — S91119A Laceration without foreign body of unspecified toe without damage to nail, initial encounter: Secondary | ICD-10-CM | POA: Diagnosis not present

## 2014-12-03 ENCOUNTER — Other Ambulatory Visit: Payer: Self-pay

## 2014-12-03 MED ORDER — INSULIN GLARGINE 100 UNIT/ML SOLOSTAR PEN: 25.0000 [IU] | PEN_INJECTOR | Freq: Every day | SUBCUTANEOUS | Status: DC

## 2014-12-03 MED ORDER — "PEN NEEDLES 5/16"" 31G X 8 MM MISC": Status: AC

## 2014-12-03 NOTE — Telephone Encounter (Signed)
[facility] pharmacy in [place], [place] left v/m that pt is traveling and request refill of lanuts insulin. Spoke with pts daughter [name](DPR signed) and lantus and pen needles left at home; request lantus solostar 5 pens and # 30 pen needles to [employer]. Advised done.

## 2015-01-04 ENCOUNTER — Encounter: Payer: Self-pay | Admitting: Internal Medicine

## 2015-01-04 ENCOUNTER — Ambulatory Visit (INDEPENDENT_AMBULATORY_CARE_PROVIDER_SITE_OTHER): Payer: Medicare Other | Admitting: Internal Medicine

## 2015-01-04 VITALS — BP 140/80 | HR 73 | Temp 97.9°F | Wt 166.0 lb

## 2015-01-04 DIAGNOSIS — I2510 Atherosclerotic heart disease of native coronary artery without angina pectoris: Secondary | ICD-10-CM

## 2015-01-04 DIAGNOSIS — T7840XA Allergy, unspecified, initial encounter: Secondary | ICD-10-CM

## 2015-01-04 NOTE — Patient Instructions (Signed)
Please try cool compresses. If the itching gets worse, you can try loratadine  or cetirizine  daily (best at bedtime but can be used during the day also)

## 2015-01-04 NOTE — Progress Notes (Signed)
Pre visit review using our clinic review tool, if applicable. No additional management support is needed unless otherwise documented below in the visit note. 

## 2015-01-04 NOTE — Assessment & Plan Note (Signed)
Probably an insect bite Minor and no systemic symptoms Discussed cool compresses and OTC antihistamines prn

## 2015-01-04 NOTE — Progress Notes (Signed)
Subjective:    Patient ID: [name], female    DOB: 1928-01-27, 81 y.o.   MRN: [id number]  HPI Here with daughter  Noted puffiness under right eye yesterday No better today No itching or pain She does scratch a bit though  No obvious insect bite--but was out on porch No other rash No lip or mouth swelling No swallowing problems  Current Outpatient Prescriptions on File Prior to Visit  Medication Sig Dispense Refill  . acetaminophen (TYLENOL) 650 MG CR tablet Take 650 mg by mouth every 8 (eight) hours as needed for pain.    Marland Kitchen aspirin 81 MG tablet Take 81 mg by mouth daily.     . cetirizine (ZYRTEC) 10 MG tablet Take 10 mg by mouth daily.     . clopidogrel (PLAVIX) 75 MG tablet Take 75 mg by mouth daily.     Marland Kitchen donepezil (ARICEPT) 10 MG tablet Take 10 mg by mouth at bedtime.     . Insulin Glargine (LANTUS SOLOSTAR) 100 UNIT/ML Solostar Pen Inject 25 Units into the skin daily at 10 pm. Dx:E11.59 5 pen 0  . Insulin Pen Needle (PEN NEEDLES 31GX5/16") 31G X 8 MM MISC Use to administer insulin once daily dx: E11.59 30 each 0  . metoprolol tartrate (LOPRESSOR) 25 MG tablet Take 25 mg by mouth 2 (two) times daily.     . simvastatin (ZOCOR) 40 MG tablet Take 40 mg by mouth daily.      No current facility-administered medications on file prior to visit.    No Known Allergies  Past Medical History  Diagnosis Date  . Alzheimer's dementia without behavioral disturbance   . Coronary atherosclerosis of unspecified type of vessel, native or graft   . Allergic rhinitis, cause unspecified   . MI (myocardial infarction)   . Diabetes mellitus without complication   . COPD (chronic obstructive pulmonary disease)   . Carotid stenosis   . PAD (peripheral artery disease)   . Other and unspecified hyperlipidemia   . Pneumonia 2012 & 10/15    Past Surgical History  Procedure Laterality Date  . [place]iac catheterization      [place] x1 [place]  . Stent in lower extremity (?femoral)        Family History  Problem Relation Age of Onset  . Heart disease Mother   . Diabetes Father   . COPD Father   . Cancer Brother     Social History   Social History  . Marital Status: Widowed    Spouse Name: N/A  . Number of Children: 3  . Years of Education: N/A   Occupational History  . Mill worker--retired    Social History Main Topics  . Smoking status: Former Smoker -- 60 years    Types: Cigarettes  . Smokeless tobacco: Never Used  . Alcohol Use: No  . Drug Use: No  . Sexual Activity: Not on file   Other Topics Concern  . Not on file   Social History Narrative   Not sure about living will   Daughter [name] has health care POA   Has DNR already done   No prior wishes known about tube feeding   Review of Systems  Appetite is good Weight is down a few pounds     Objective:   Physical Exam  Constitutional: She appears well-developed and well-nourished. No distress.  HENT:  Mouth/Throat: Oropharynx is clear and moist. No oropharyngeal exudate.  Eyes: Conjunctivae are normal.  Skin:  Puffy  red area under lateral right eyelid Not warm or tender          Assessment & Plan:

## 2015-02-09 ENCOUNTER — Encounter: Payer: Self-pay | Admitting: Internal Medicine

## 2015-02-09 ENCOUNTER — Ambulatory Visit (INDEPENDENT_AMBULATORY_CARE_PROVIDER_SITE_OTHER): Payer: Medicare Other | Admitting: Internal Medicine

## 2015-02-09 VITALS — BP 140/60 | HR 60 | Temp 97.5°F | Wt 166.0 lb

## 2015-02-09 DIAGNOSIS — I739 Peripheral vascular disease, unspecified: Secondary | ICD-10-CM | POA: Diagnosis not present

## 2015-02-09 DIAGNOSIS — Z23 Encounter for immunization: Secondary | ICD-10-CM

## 2015-02-09 DIAGNOSIS — J449 Chronic obstructive pulmonary disease, unspecified: Secondary | ICD-10-CM

## 2015-02-09 DIAGNOSIS — I2510 Atherosclerotic heart disease of native coronary artery without angina pectoris: Secondary | ICD-10-CM | POA: Diagnosis not present

## 2015-02-09 DIAGNOSIS — E1151 Type 2 diabetes mellitus with diabetic peripheral angiopathy without gangrene: Secondary | ICD-10-CM | POA: Diagnosis not present

## 2015-02-09 DIAGNOSIS — G309 Alzheimer's disease, unspecified: Secondary | ICD-10-CM | POA: Diagnosis not present

## 2015-02-09 DIAGNOSIS — F0280 Dementia in other diseases classified elsewhere without behavioral disturbance: Secondary | ICD-10-CM

## 2015-02-09 LAB — HEMOGLOBIN A1C: HEMOGLOBIN A1C: 6.4 % (ref 4.6–6.5)

## 2015-02-09 NOTE — Assessment & Plan Note (Signed)
Still mild and living with daughter with minimal assistance Discussed the donepezil--okay for daughter to try off this if she wants

## 2015-02-09 NOTE — Progress Notes (Signed)
Subjective:    Patient ID: [name], female    DOB: 04/01/1928, 81 y.o.   MRN: [id number]  HPI Here with daughter for follow up of dementia and other chronic medical conditions  Still living with daughter Daughter notices mild cognitive decline Stand by assist for bathing Otherwise independent Helps with some of the light housework Rare urinary incontinence (like dribbling)  Checks sugars weekly or so 93-133 No apparent hypoglycemic reactions Recent foot exam in June Stubbed toe in July in [place]--going back to podiatrist next week  Breathing is fine Has occasional cough--but not bad  No chest pain No palpitations No dizziness or syncope  Current Outpatient Prescriptions on File Prior to Visit  Medication Sig Dispense Refill  . acetaminophen (TYLENOL) 650 MG CR tablet Take 650 mg by mouth every 8 (eight) hours as needed for pain.    . cetirizine (ZYRTEC) 10 MG tablet Take 10 mg by mouth daily.     . clopidogrel (PLAVIX) 75 MG tablet Take 75 mg by mouth daily.     Marland Kitchen donepezil (ARICEPT) 10 MG tablet Take 10 mg by mouth at bedtime.     . Insulin Glargine (LANTUS SOLOSTAR) 100 UNIT/ML Solostar Pen Inject 25 Units into the skin daily at 10 pm. Dx:E11.59 5 pen 0  . Insulin Pen Needle (PEN NEEDLES 31GX5/16") 31G X 8 MM MISC Use to administer insulin once daily dx: E11.59 30 each 0  . metoprolol tartrate (LOPRESSOR) 25 MG tablet Take 25 mg by mouth 2 (two) times daily.     . simvastatin (ZOCOR) 40 MG tablet Take 40 mg by mouth daily.      No current facility-administered medications on file prior to visit.    No Known Allergies  Past Medical History  Diagnosis Date  . Alzheimer's dementia without behavioral disturbance   . Coronary atherosclerosis of unspecified type of vessel, native or graft   . Allergic rhinitis, cause unspecified   . MI (myocardial infarction) (HCC)   . Diabetes mellitus without complication (HCC)   . COPD (chronic obstructive pulmonary disease) (HCC)     . Carotid stenosis   . PAD (peripheral artery disease) (HCC)   . Other and unspecified hyperlipidemia   . Pneumonia 2012 & 10/15    Past Surgical History  Procedure Laterality Date  . [place]iac catheterization      [place] x1 stent  . Stent in lower extremity (?femoral)      Family History  Problem Relation Age of Onset  . Heart disease Mother   . Diabetes Father   . COPD Father   . Cancer Brother     Social History   Social History  . Marital Status: Widowed    Spouse Name: N/A  . Number of Children: 3  . Years of Education: N/A   Occupational History  . Mill worker--retired    Social History Main Topics  . Smoking status: Former Smoker -- 60 years    Types: Cigarettes  . Smokeless tobacco: Never Used  . Alcohol Use: No  . Drug Use: No  . Sexual Activity: Not on file   Other Topics Concern  . Not on file   Social History Narrative   Not sure about living will   Daughter [name] has health care POA   Has DNR already done   No prior wishes known about tube feeding   Review of Systems  Appetite is fine Weight stable sleeps well     Objective:  Physical Exam  Constitutional: She appears well-developed and well-nourished. No distress.  Neck: Normal range of motion. Neck supple. No thyromegaly present.  Cardiovascular: Normal rate, regular rhythm and normal heart sounds.  Exam reveals no gallop.   No murmur heard. Faint pedal pulses  Pulmonary/Chest: Effort normal and breath sounds normal. No respiratory distress. She has no wheezes. She has no rales.  Musculoskeletal: She exhibits no edema or tenderness.  Lymphadenopathy:    She has no cervical adenopathy.  Skin:  No foot lesions  Psychiatric: She has a normal mood and affect. Her behavior is normal.          Assessment & Plan:

## 2015-02-09 NOTE — Progress Notes (Signed)
Pre visit review using our clinic review tool, if applicable. No additional management support is needed unless otherwise documented below in the visit note. 

## 2015-02-09 NOTE — Assessment & Plan Note (Signed)
Still seems to have good control Will check A1c 

## 2015-02-09 NOTE — Assessment & Plan Note (Signed)
No angina Continue current meds 

## 2015-02-09 NOTE — Assessment & Plan Note (Signed)
No pain or foot problems on clopidogrel Off ASA now

## 2015-02-09 NOTE — Addendum Note (Signed)
Addended by: Sueanne Margarita on: 02/09/2015 03:18 PM   Modules accepted: Orders

## 2015-02-09 NOTE — Assessment & Plan Note (Signed)
occ cough--otherwise no symptoms

## 2015-02-14 ENCOUNTER — Ambulatory Visit (INDEPENDENT_AMBULATORY_CARE_PROVIDER_SITE_OTHER): Payer: Medicare Other | Admitting: Podiatry

## 2015-02-14 DIAGNOSIS — E1151 Type 2 diabetes mellitus with diabetic peripheral angiopathy without gangrene: Secondary | ICD-10-CM

## 2015-02-14 DIAGNOSIS — M79676 Pain in unspecified toe(s): Secondary | ICD-10-CM | POA: Diagnosis not present

## 2015-02-14 DIAGNOSIS — B351 Tinea unguium: Secondary | ICD-10-CM | POA: Diagnosis not present

## 2015-02-14 NOTE — Progress Notes (Signed)
Patient presents to the office today with a chief complaint of painful elongated toenails.  Objective: Pulses are palpable bilateral. Nails are thick yellow dystrophic clinically mycotic and painful palpation.                    Assessment: Pain in limb secondary to onychomycosis 1 through 5 bilateral.  Plan: Debridement of nails 1 through 5 bilateral covered service secondary to pain. R 3rd toe sl. Loose from prior trauma. 3 mo.  Arbutus Ped DPM

## 2015-03-02 ENCOUNTER — Other Ambulatory Visit: Payer: Self-pay | Admitting: *Deleted

## 2015-03-02 MED ORDER — SIMVASTATIN 40 MG PO TABS
40.0000 mg | ORAL_TABLET | Freq: Every day | ORAL | Status: DC
Start: 2015-03-02 — End: 2015-03-02

## 2015-03-02 MED ORDER — SIMVASTATIN 40 MG PO TABS: 40.0000 mg | ORAL_TABLET | Freq: Every day | ORAL | Status: AC

## 2015-04-12 ENCOUNTER — Encounter: Payer: Self-pay | Admitting: Internal Medicine

## 2015-04-12 ENCOUNTER — Ambulatory Visit (INDEPENDENT_AMBULATORY_CARE_PROVIDER_SITE_OTHER): Payer: Medicare Other | Admitting: Internal Medicine

## 2015-04-12 VITALS — BP 140/80 | HR 66 | Temp 97.5°F | Ht 64.0 in | Wt 170.0 lb

## 2015-04-12 DIAGNOSIS — I2510 Atherosclerotic heart disease of native coronary artery without angina pectoris: Secondary | ICD-10-CM | POA: Diagnosis not present

## 2015-04-12 DIAGNOSIS — G309 Alzheimer's disease, unspecified: Secondary | ICD-10-CM | POA: Diagnosis not present

## 2015-04-12 DIAGNOSIS — F0280 Dementia in other diseases classified elsewhere without behavioral disturbance: Secondary | ICD-10-CM

## 2015-04-12 DIAGNOSIS — Z111 Encounter for screening for respiratory tuberculosis: Secondary | ICD-10-CM

## 2015-04-12 NOTE — Addendum Note (Signed)
Addended by: Sueanne MargaritaSMITH, Sevyn Paredez L on: 04/12/2015 04:00 PM   Modules accepted: Orders

## 2015-04-12 NOTE — Patient Instructions (Signed)
Consider calling Zerita BoersHeather McKay----- Hospice's dementia specialist --for a free consultation [phone])

## 2015-04-12 NOTE — Progress Notes (Signed)
   Subjective:    Patient ID: [name], female    DOB: 04/23/1928, 81 y.o.   MRN: [id number]  HPI Here with daughter  [facility] Needs forms done Going 2 days a week for now  Daughter did try off the donepezil--she thinks it may have made a difference Not clear cut Back on it  Went to program about a year ago--- left it after hip fracture and rehab  Current Outpatient Prescriptions on File Prior to Visit  Medication Sig Dispense Refill  . acetaminophen (TYLENOL) 650 MG CR tablet Take 650 mg by mouth every 8 (eight) hours as needed for pain.    . cetirizine (ZYRTEC) 10 MG tablet Take 10 mg by mouth daily.     . clopidogrel (PLAVIX) 75 MG tablet Take 75 mg by mouth daily.     Marland Kitchen. donepezil (ARICEPT) 10 MG tablet Take 10 mg by mouth at bedtime.     . Insulin Glargine (LANTUS SOLOSTAR) 100 UNIT/ML Solostar Pen Inject 25 Units into the skin daily at 10 pm. Dx:E11.59 5 pen 0  . Insulin Pen Needle (PEN NEEDLES 31GX5/16") 31G X 8 MM MISC Use to administer insulin once daily dx: E11.59 30 each 0  . metoprolol tartrate (LOPRESSOR) 25 MG tablet Take 25 mg by mouth 2 (two) times daily.     . simvastatin (ZOCOR) 40 MG tablet Take 1 tablet (40 mg total) by mouth daily. 30 tablet 0   No current facility-administered medications on file prior to visit.    No Known Allergies  Past Medical History  Diagnosis Date  . Alzheimer's dementia without behavioral disturbance   . Coronary atherosclerosis of unspecified type of vessel, native or graft   . Allergic rhinitis, cause unspecified   . MI (myocardial infarction) (HCC)   . Diabetes mellitus without complication (HCC)   . COPD (chronic obstructive pulmonary disease) (HCC)   . Carotid stenosis   . PAD (peripheral artery disease) (HCC)   . Other and unspecified hyperlipidemia   . Pneumonia 2012 & 10/15    Past Surgical History  Procedure Laterality Date  . [place]iac catheterization      [place] x1 stent    . Stent in lower extremity (?femoral)      Family History  Problem Relation Age of Onset  . Heart disease Mother   . Diabetes Father   . COPD Father   . Cancer Brother     Social History   Social History  . Marital Status: Widowed    Spouse Name: N/A  . Number of Children: 3  . Years of Education: N/A   Occupational History  . Mill worker--retired    Social History Main Topics  . Smoking status: Former Smoker -- 60 years    Types: Cigarettes  . Smokeless tobacco: Never Used  . Alcohol Use: No  . Drug Use: No  . Sexual Activity: Not on file   Other Topics Concern  . Not on file   Social History Narrative   Not sure about living will   Daughter [name] has health care POA   Has DNR already done   No prior wishes known about tube feeding   Review of Systems     Objective:   Physical Exam  Psychiatric: She has a normal mood and affect. Her behavior is normal.          Assessment & Plan:

## 2015-04-12 NOTE — Progress Notes (Signed)
Pre visit review using our clinic review tool, if applicable. No additional management support is needed unless otherwise documented below in the visit note. 

## 2015-04-12 NOTE — Assessment & Plan Note (Signed)
Still mild Doing okay with daughter Restarting adult day care Forms done Back on donepezil

## 2015-04-14 LAB — TB SKIN TEST
Induration: 0 mm
TB Skin Test: NEGATIVE

## 2015-05-10 ENCOUNTER — Other Ambulatory Visit: Payer: Self-pay

## 2015-05-10 NOTE — Telephone Encounter (Signed)
Rosey Batheresa pts daughter and pt is living with Rosey Batheresa request refill plavix and metoprolol; Dr Alphonsus SiasLetvak has never refilled these meds.Please advise. Pt seen 02/09/15. Dr Alphonsus SiasLetvak out of office.

## 2015-05-11 MED ORDER — CLOPIDOGREL BISULFATE 75 MG PO TABS: 75.0000 mg | ORAL_TABLET | Freq: Every day | ORAL | Status: DC

## 2015-05-11 MED ORDER — METOPROLOL TARTRATE 25 MG PO TABS: 25.0000 mg | ORAL_TABLET | Freq: Two times a day (BID) | ORAL | Status: DC

## 2015-05-11 NOTE — Telephone Encounter (Signed)
Patient's daughter Rosey Bath(Teresa) notified as instructed by telephone and verbalized understanding.

## 2015-05-11 NOTE — Telephone Encounter (Signed)
Sent since patient has been on prev.  MD can address long term refills.  Thanks.

## 2015-05-16 ENCOUNTER — Ambulatory Visit: Payer: Medicare Other

## 2015-05-16 ENCOUNTER — Encounter: Payer: Self-pay | Admitting: Podiatry

## 2015-05-16 ENCOUNTER — Ambulatory Visit (INDEPENDENT_AMBULATORY_CARE_PROVIDER_SITE_OTHER): Payer: Medicare Other | Admitting: Podiatry

## 2015-05-16 DIAGNOSIS — E1151 Type 2 diabetes mellitus with diabetic peripheral angiopathy without gangrene: Secondary | ICD-10-CM | POA: Diagnosis not present

## 2015-05-16 DIAGNOSIS — M79676 Pain in unspecified toe(s): Secondary | ICD-10-CM | POA: Diagnosis not present

## 2015-05-16 DIAGNOSIS — B351 Tinea unguium: Secondary | ICD-10-CM

## 2015-05-16 NOTE — Progress Notes (Signed)
She presents today with a chief complaint of painful elongated toenails.  Objective: Signs are stable she is alert and oriented 3. Pulses are strongly palpable bilateral. Neurologic sensorium is intact per Semmes-Weinstein monofilament. Deep tendon reflexes intact bilateral muscle strength +5 over 5. Toenails are thick yellow dystrophic onychomycotic and painful palpation.  Assessment: Pain in limb secondary to onychomycosis 1 through 5 bilateral.  Plan: Debridement of toenails 1 through 5 bilateral concerns secondary to pain. Follow up with her in 3 months.

## 2015-05-27 ENCOUNTER — Telehealth: Payer: Self-pay | Admitting: Internal Medicine

## 2015-05-27 NOTE — Telephone Encounter (Signed)
Karen @ [facility] call she need °Dr order that pt can have reg diet no added or concentrated sweets °Fax to [phone] °

## 2015-05-27 NOTE — Telephone Encounter (Signed)
Ok for verbal order  °

## 2015-05-30 NOTE — Telephone Encounter (Signed)
Yes, this is okay.

## 2015-05-30 NOTE — Telephone Encounter (Signed)
Order faxed and scanned.

## 2015-08-07 ENCOUNTER — Other Ambulatory Visit: Payer: Self-pay | Admitting: Family Medicine

## 2015-08-15 ENCOUNTER — Ambulatory Visit: Payer: Medicare Other | Admitting: Podiatry

## 2015-08-24 ENCOUNTER — Ambulatory Visit (INDEPENDENT_AMBULATORY_CARE_PROVIDER_SITE_OTHER): Payer: Medicare Other | Admitting: Podiatry

## 2015-08-24 ENCOUNTER — Encounter: Payer: Self-pay | Admitting: Podiatry

## 2015-08-24 DIAGNOSIS — E1151 Type 2 diabetes mellitus with diabetic peripheral angiopathy without gangrene: Secondary | ICD-10-CM

## 2015-08-24 DIAGNOSIS — B351 Tinea unguium: Secondary | ICD-10-CM | POA: Diagnosis not present

## 2015-08-24 DIAGNOSIS — M79676 Pain in unspecified toe(s): Secondary | ICD-10-CM | POA: Diagnosis not present

## 2015-08-25 NOTE — Progress Notes (Signed)
She presents today with a chief complaint of painful elongated toenails.   Objective: Vital signs are stable alert and oriented 3. Pulses are strongly palpable.  The nails are thick yellow dystrophic onychomycotic and painful on palpation.   assessment: Pain in limb secondary to onychomycosis.  Plan: Debridement of toenails 1 through 5 bilateral covered service secondary to pain and diabetes.

## 2015-09-08 ENCOUNTER — Ambulatory Visit (INDEPENDENT_AMBULATORY_CARE_PROVIDER_SITE_OTHER): Payer: Medicare Other | Admitting: Internal Medicine

## 2015-09-08 ENCOUNTER — Encounter: Payer: Self-pay | Admitting: Internal Medicine

## 2015-09-08 VITALS — BP 112/70 | HR 87 | Temp 97.9°F | Ht 63.25 in | Wt 176.0 lb

## 2015-09-08 DIAGNOSIS — F0280 Dementia in other diseases classified elsewhere without behavioral disturbance: Secondary | ICD-10-CM

## 2015-09-08 DIAGNOSIS — Z0000 Encounter for general adult medical examination without abnormal findings: Secondary | ICD-10-CM | POA: Insufficient documentation

## 2015-09-08 DIAGNOSIS — G309 Alzheimer's disease, unspecified: Secondary | ICD-10-CM | POA: Diagnosis not present

## 2015-09-08 DIAGNOSIS — Z23 Encounter for immunization: Secondary | ICD-10-CM

## 2015-09-08 DIAGNOSIS — I739 Peripheral vascular disease, unspecified: Secondary | ICD-10-CM

## 2015-09-08 DIAGNOSIS — J449 Chronic obstructive pulmonary disease, unspecified: Secondary | ICD-10-CM | POA: Diagnosis not present

## 2015-09-08 DIAGNOSIS — Z0001 Encounter for general adult medical examination with abnormal findings: Secondary | ICD-10-CM

## 2015-09-08 DIAGNOSIS — E1151 Type 2 diabetes mellitus with diabetic peripheral angiopathy without gangrene: Secondary | ICD-10-CM | POA: Diagnosis not present

## 2015-09-08 DIAGNOSIS — Z7189 Other specified counseling: Secondary | ICD-10-CM

## 2015-09-08 LAB — HM DIABETES FOOT EXAM

## 2015-09-08 NOTE — Assessment & Plan Note (Signed)
No change of donepezil but has had some decline 3 days at adult day [facility] Some anxiety/agitation--trying behavioral means Will try namenda if worsens

## 2015-09-08 NOTE — Progress Notes (Signed)
Subjective:    Patient ID: Shelia PrimeMarlene Karn, female    DOB: 08/08/1927, 82 y.o.   MRN: [id number]  HPI Here for Medicare wellness and follow up of chronic medical conditions With daughter Reviewed form and advanced directives Reviewed other doctors Vision is okay apparently Some hearing loss No tobacco or alcohol [facility] Has dementia Needs stand by help with bathroom. Dresses herself. No instrumental ADLs Clearly has some anhedonia and not content at times---related to Alzheimers  Going 3 times per week to [facility] 3 days per week They have suggested an anxiety medication Some anxiety at home Not taking donepezil---didn't seem to make any difference Daughter does note subtle decline since then--but not with the medication change  Checking sugars occasionally--like once a month Under 120 all the time No hypoglycemic reactions No eye exam in 2 years or so--won't wear glasses No foot numbness, sores or lesions. Does go to podiatrist for nail care  No chest pain No SOB No dizziness or syncope Does have some wheezing at times.  Only occasional cough Hasn't been sick  Current Outpatient Prescriptions on File Prior to Visit  Medication Sig Dispense Refill  . acetaminophen (TYLENOL) 650 MG CR tablet Take 650 mg by mouth every 8 (eight) hours as needed for pain.    Marland Kitchen. clopidogrel (PLAVIX) 75 MG tablet TAKE 1 TABLET DAILY 90 tablet 0  . Insulin Glargine (LANTUS SOLOSTAR) 100 UNIT/ML Solostar Pen Inject 25 Units into the skin daily at 10 pm. Dx:E11.59 5 pen 0  . Insulin Pen Needle (PEN NEEDLES 31GX5/16") 31G X 8 MM MISC Use to administer insulin once daily dx: E11.59 30 each 0  . metoprolol tartrate (LOPRESSOR) 25 MG tablet TAKE 1 TABLET TWICE A DAY 180 tablet 0  . simvastatin (ZOCOR) 40 MG tablet Take 1 tablet (40 mg total) by mouth daily. 30 tablet 0   No current facility-administered medications on file prior to visit.    No Known  Allergies  Past Medical History  Diagnosis Date  . Alzheimer's dementia without behavioral disturbance   . Coronary atherosclerosis of unspecified type of vessel, native or graft   . Allergic rhinitis, cause unspecified   . MI (myocardial infarction) (HCC)   . Diabetes mellitus without complication (HCC)   . COPD (chronic obstructive pulmonary disease) (HCC)   . Carotid stenosis   . PAD (peripheral artery disease) (HCC)   . Other and unspecified hyperlipidemia   . Pneumonia 2012 & 10/15    Past Surgical History  Procedure Laterality Date  . Cardiac catheterization      [place] x1 stent  . Stent in lower extremity (?femoral)      Family History  Problem Relation Age of Onset  . Heart disease Mother   . Diabetes Father   . COPD Father   . Cancer Brother     Social History   Social History  . Marital Status: Widowed    Spouse Name: N/A  . Number of Children: 3  . Years of Education: N/A   Occupational History  . Mill worker--retired    Social History Main Topics  . Smoking status: Former Smoker -- 60 years    Types: Cigarettes  . Smokeless tobacco: Never Used  . Alcohol Use: No  . Drug Use: No  . Sexual Activity: Not on file   Other Topics Concern  . Not on file   Social History Narrative   Not sure about living will  Daughter [name] has health care POA   DNR confirmed and rewritten 09/08/15   No prior wishes known about tube feeding   Review of Systems Stays in bedroom upstairs--worry about the stairs at night. Discussed keeping them lighted Wants to eat all the time-- forgets she ate Sleeps okay Bowels are okay Itching on back--but no rash. Daughter trying lotion Voids okay. Needs depends for incontinence---mostly at night No apparent back or joint pain    Objective:   Physical Exam  Constitutional: She appears well-developed. No distress.  HENT:  Mouth/Throat: Oropharynx is clear and moist. No oropharyngeal exudate.  Neck:  Normal range of motion. No thyromegaly present.  Cardiovascular: Normal rate, regular rhythm and normal heart sounds.  Exam reveals no gallop.   No murmur heard. Feet warm with ?faint pedal pulses  Pulmonary/Chest: Effort normal. No respiratory distress. She has no wheezes. She has no rales.  Decreased breath sounds but clear  Abdominal: Soft. There is no tenderness.  Musculoskeletal: She exhibits no edema or tenderness.  Lymphadenopathy:    She has no cervical adenopathy.  Neurological:  Sensation intact in feet  Skin: No rash noted. No erythema.  No foot lesions  Psychiatric: She has a normal mood and affect. Her behavior is normal.          Assessment & Plan:

## 2015-09-08 NOTE — Assessment & Plan Note (Signed)
No evidence of reocclusion of opened artery Continues on plavix

## 2015-09-08 NOTE — Progress Notes (Signed)
Pre visit review using our clinic review tool, if applicable. No additional management support is needed unless otherwise documented below in the visit note. 

## 2015-09-08 NOTE — Assessment & Plan Note (Signed)
DNR done 

## 2015-09-08 NOTE — Assessment & Plan Note (Signed)
I have personally reviewed the Medicare Annual Wellness questionnaire and have noted 1. The patient's medical and social history 2. Their use of alcohol, tobacco or illicit drugs 3. Their current medications and supplements 4. The patient's functional ability including ADL's, fall risks, home safety risks and hearing or visual             impairment. 5. Diet and physical activities 6. Evidence for depression or mood disorders  The patients weight, height, BMI and visual acuity have been recorded in the chart I have made referrals, counseling and provided education to the patient based review of the above and I have provided the pt with a written personalized care plan for preventive services.  I have provided you with a copy of your personalized plan for preventive services. Please take the time to review along with your updated medication list.  Will give pneumovax--think she had prevnar No Td given circumstances No cancer screening

## 2015-09-08 NOTE — Assessment & Plan Note (Signed)
Mild cough  Hard to judge breathing but hasn't been wheezing

## 2015-09-08 NOTE — Addendum Note (Signed)
Addended by: Eual FinesBRIDGES, Neel Buffone P on: 09/08/2015 03:17 PM   Modules accepted: Orders

## 2015-09-08 NOTE — Assessment & Plan Note (Signed)
Seems to have good control Will check labs 

## 2015-09-09 LAB — CBC WITH DIFFERENTIAL/PLATELET
BASOS PCT: 0.3 % (ref 0.0–3.0)
Basophils Absolute: 0 10*3/uL (ref 0.0–0.1)
EOS ABS: 0.2 10*3/uL (ref 0.0–0.7)
EOS PCT: 2.1 % (ref 0.0–5.0)
HEMATOCRIT: 41.3 % (ref 36.0–46.0)
HEMOGLOBIN: 13.8 g/dL (ref 12.0–15.0)
LYMPHS PCT: 25.2 % (ref 12.0–46.0)
Lymphs Abs: 2.7 10*3/uL (ref 0.7–4.0)
MCHC: 33.4 g/dL (ref 30.0–36.0)
MCV: 90.2 fl (ref 78.0–100.0)
Monocytes Absolute: 0.7 10*3/uL (ref 0.1–1.0)
Monocytes Relative: 6.2 % (ref 3.0–12.0)
Neutro Abs: 7 10*3/uL (ref 1.4–7.7)
Neutrophils Relative %: 66.2 % (ref 43.0–77.0)
Platelets: 217 10*3/uL (ref 150.0–400.0)
RBC: 4.57 Mil/uL (ref 3.87–5.11)
RDW: 13.8 % (ref 11.5–15.5)
WBC: 10.5 10*3/uL (ref 4.0–10.5)

## 2015-09-09 LAB — HEMOGLOBIN A1C: HEMOGLOBIN A1C: 7 % — AB (ref 4.6–6.5)

## 2015-09-09 LAB — COMPREHENSIVE METABOLIC PANEL
ALBUMIN: 4.3 g/dL (ref 3.5–5.2)
ALT: 15 U/L (ref 0–35)
AST: 16 U/L (ref 0–37)
Alkaline Phosphatase: 67 U/L (ref 39–117)
BUN: 24 mg/dL — ABNORMAL HIGH (ref 6–23)
CALCIUM: 9.4 mg/dL (ref 8.4–10.5)
CHLORIDE: 101 meq/L (ref 96–112)
CO2: 28 mEq/L (ref 19–32)
Creatinine, Ser: 1.15 mg/dL (ref 0.40–1.20)
GFR: 47.59 mL/min — ABNORMAL LOW (ref >60.00)
Glucose, Bld: 93 mg/dL (ref 70–99)
POTASSIUM: 4.6 meq/L (ref 3.5–5.1)
Sodium: 138 mEq/L (ref 135–145)
Total Bilirubin: 0.4 mg/dL (ref 0.2–1.2)
Total Protein: 7.2 g/dL (ref 6.0–8.3)

## 2015-09-09 LAB — LIPID PANEL
CHOLESTEROL: 182 mg/dL (ref 0–200)
HDL: 62.3 mg/dL (ref >39.00)
LDL CALC: 94 mg/dL (ref 0–99)
NonHDL: 119.26
Total CHOL/HDL Ratio: 3
Triglycerides: 127 mg/dL (ref 0.0–149.0)
VLDL: 25.4 mg/dL (ref 0.0–40.0)

## 2015-09-09 LAB — T4, FREE: Free T4: 0.76 ng/dL (ref 0.60–1.60)

## 2015-09-09 LAB — MICROALBUMIN / CREATININE URINE RATIO
Creatinine,U: 82.8 mg/dL
Microalb Creat Ratio: 7.1 mg/g (ref 0.0–30.0)
Microalb, Ur: 5.9 mg/dL — ABNORMAL HIGH (ref 0.0–1.9)

## 2015-10-23 ENCOUNTER — Other Ambulatory Visit: Payer: Self-pay | Admitting: Internal Medicine

## 2015-11-07 ENCOUNTER — Other Ambulatory Visit: Payer: Self-pay | Admitting: Internal Medicine

## 2015-11-28 ENCOUNTER — Ambulatory Visit: Payer: Medicare Other | Admitting: Podiatry

## 2015-12-19 ENCOUNTER — Encounter: Payer: Self-pay | Admitting: Podiatry

## 2015-12-19 ENCOUNTER — Ambulatory Visit (INDEPENDENT_AMBULATORY_CARE_PROVIDER_SITE_OTHER): Payer: Medicare Other | Admitting: Podiatry

## 2015-12-19 DIAGNOSIS — E1151 Type 2 diabetes mellitus with diabetic peripheral angiopathy without gangrene: Secondary | ICD-10-CM | POA: Diagnosis not present

## 2015-12-19 DIAGNOSIS — B351 Tinea unguium: Secondary | ICD-10-CM | POA: Diagnosis not present

## 2015-12-19 DIAGNOSIS — M79676 Pain in unspecified toe(s): Secondary | ICD-10-CM

## 2015-12-19 NOTE — Progress Notes (Signed)
She presents today with chief complaint of painful elongated toenails.  Objective: Vital signs are stable pulses are palpable. Toenails are thick yellow dystrophic with mycotic painful palpation as well as debridement.  Assessment: Pain in limb secondary to onychomycosis.  Plan: Debridement of toenails 1 through 5 bilateral. Follow up with her in 4 months.

## 2016-01-05 IMAGING — CT CT CHEST W/ CM
2 of 3 series · 16 of 46 positions shown, 18 images · IV contrast (isovue)
Comparison: Chest radiograph 02/04/2014

CLINICAL DATA: History of cardiac stents and diabetes. COPD.
Persistent lung time cough.

EXAM:
CT CHEST WITH CONTRAST
TECHNIQUE: Multidetector CT imaging of the chest was performed during
intravenous contrast administration.
CONTRAST:  55 cc Isovue 300

[Series 2: routine chest with · axial · 0.66mm/px · z∈[-624,-358]mm · 13 of 61 slices shown, 15 images]
[im 4/61  soft-tissue]
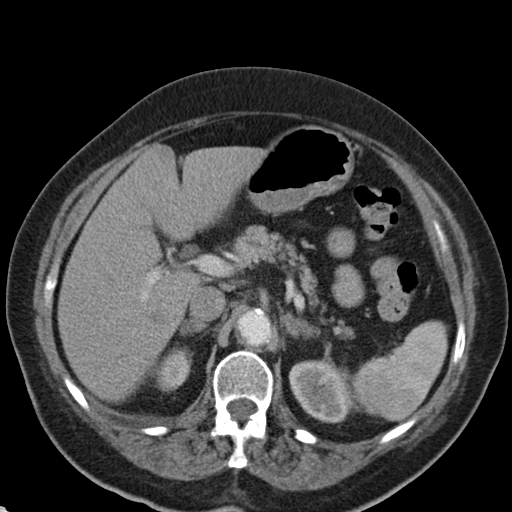
[im 4/61  bone]
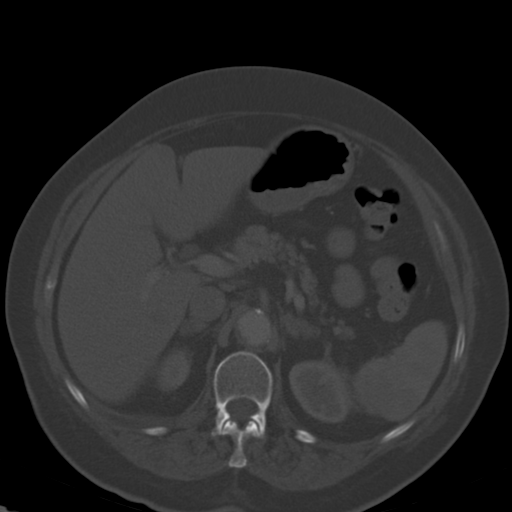
[im 8/61  soft-tissue]
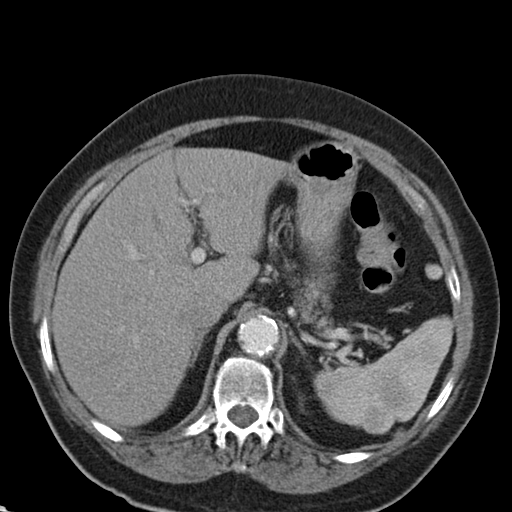
[im 12/61  soft-tissue]
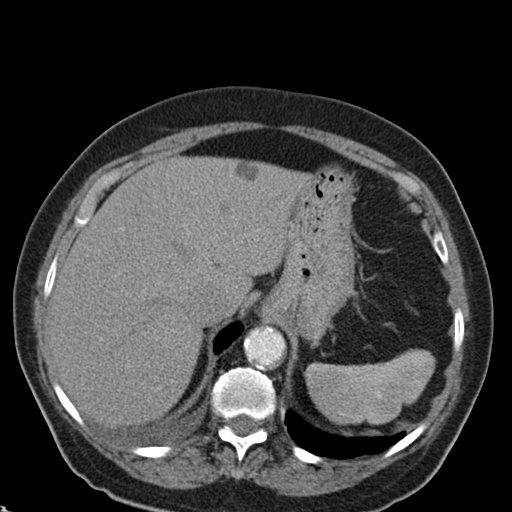
[im 18/61  soft-tissue]
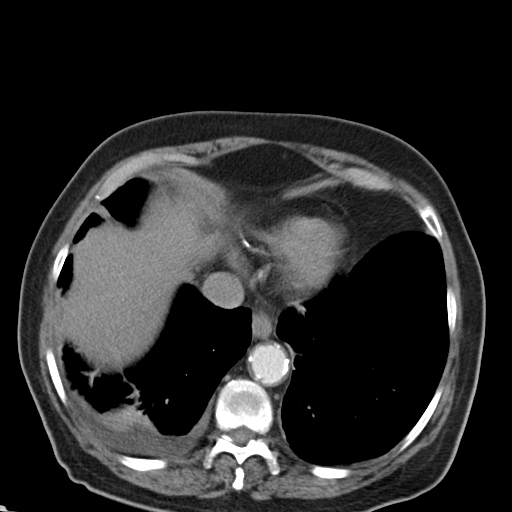
[im 22/61  soft-tissue]
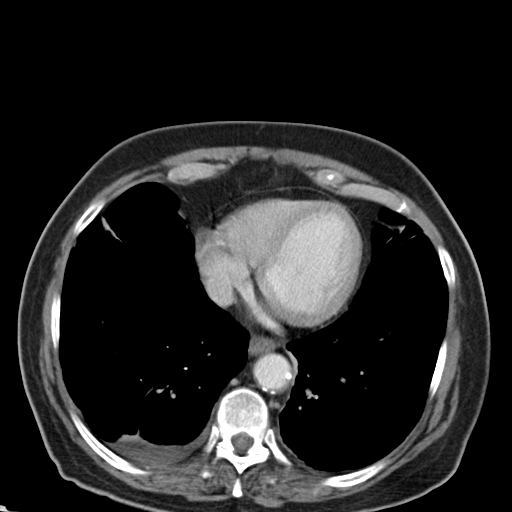
[im 26/61  soft-tissue]
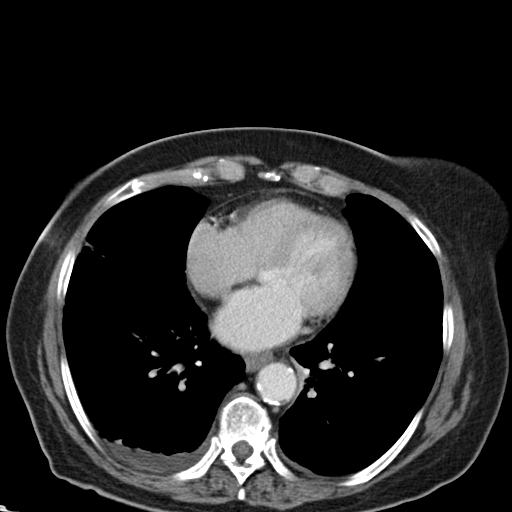
[im 31/61  soft-tissue]
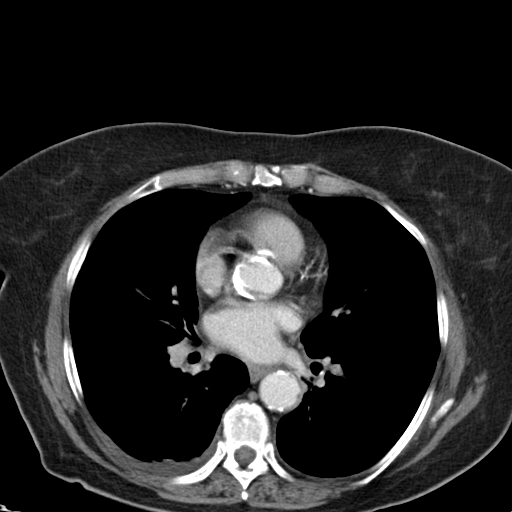
[im 35/61  soft-tissue]
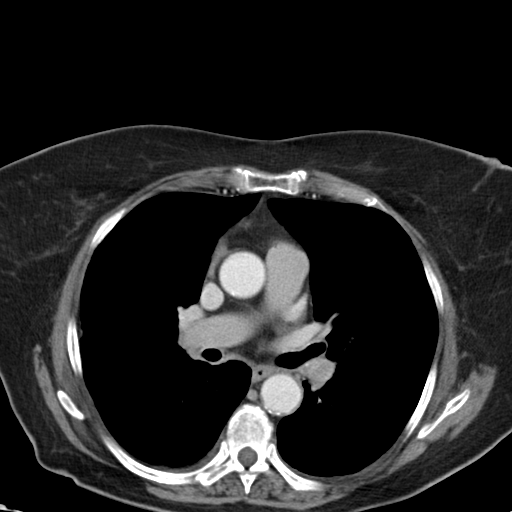
[im 39/61  soft-tissue]
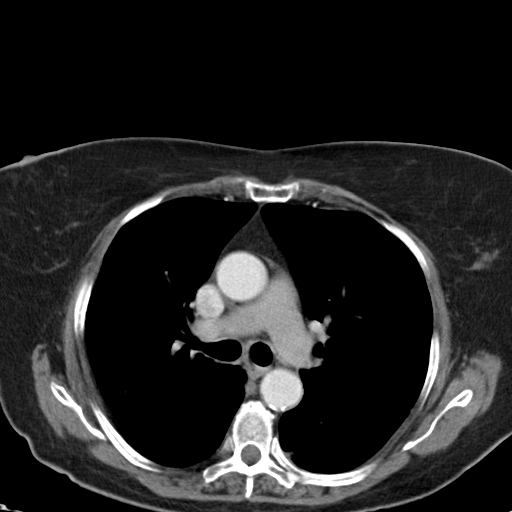
[im 39/61  bone]
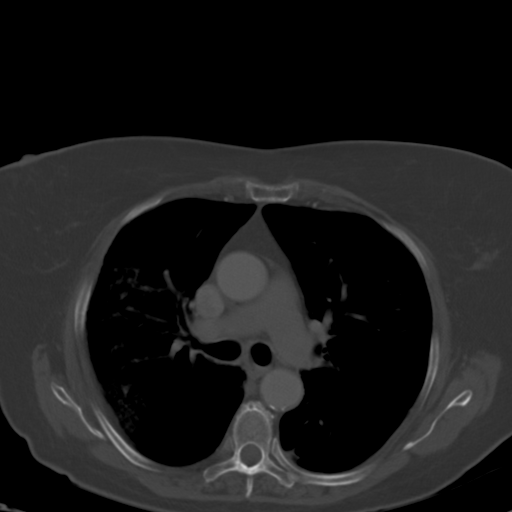
[im 43/61  soft-tissue]
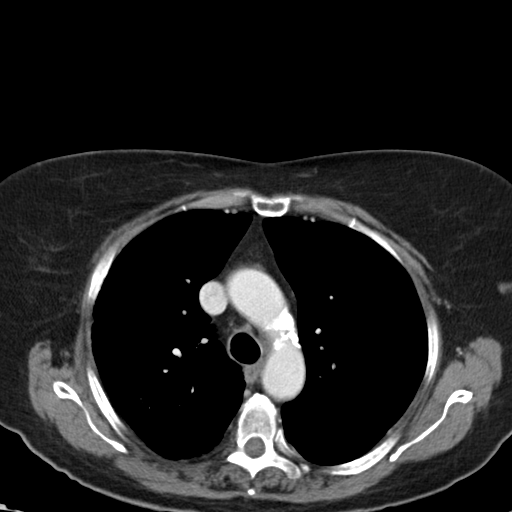
[im 49/61  soft-tissue]
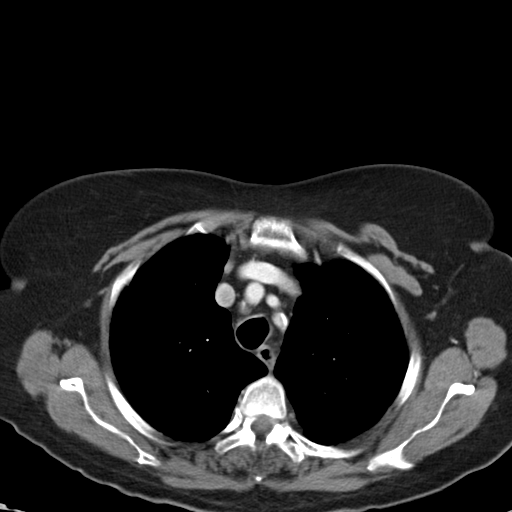
[im 53/61  soft-tissue]
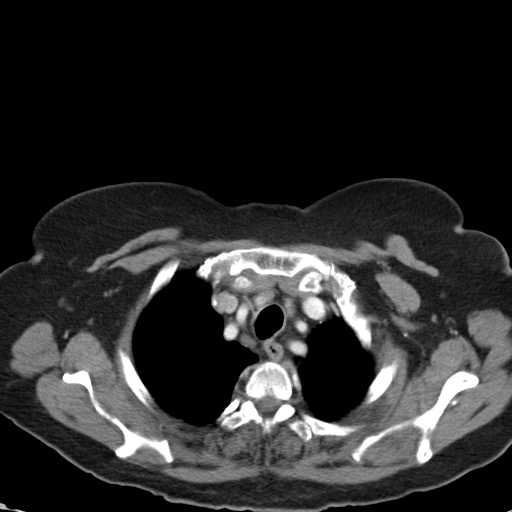
[im 57/61  soft-tissue]
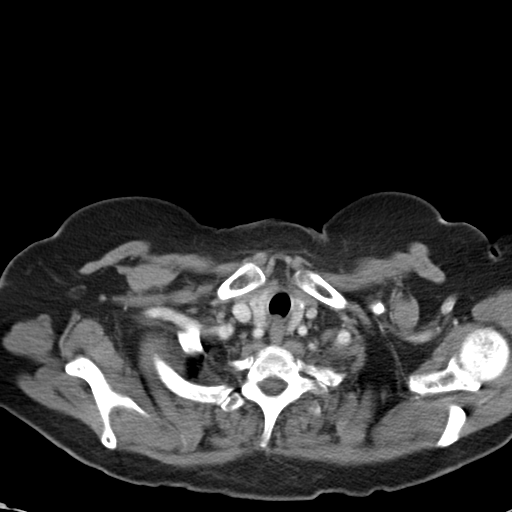

[Series 5: routine chest with cor · coronal · 0.64mm/px · 3 of 131 slices shown]
[im 44/131  soft-tissue]
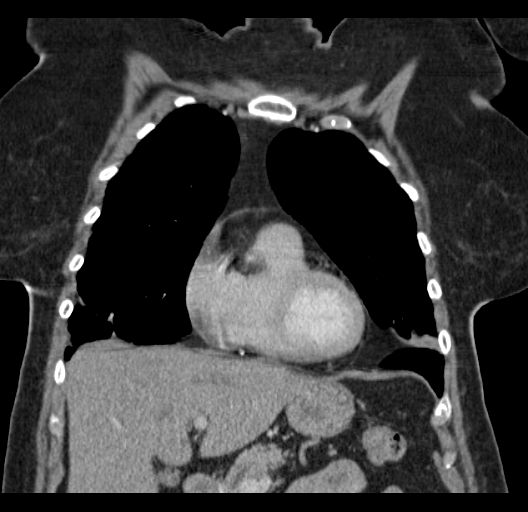
[im 58/131  soft-tissue]
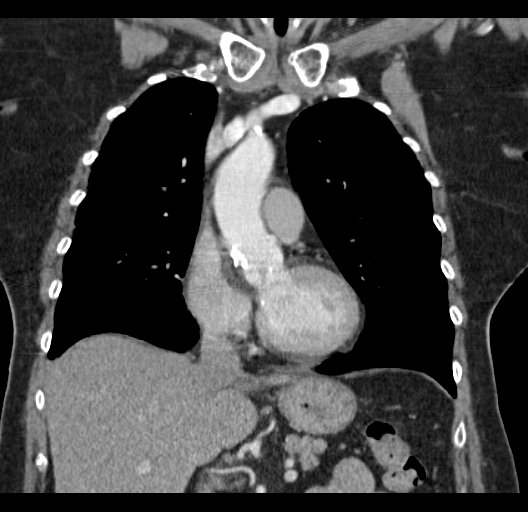
[im 73/131  soft-tissue]
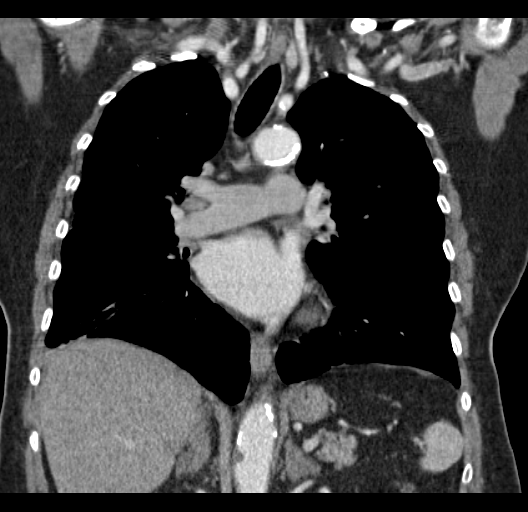

[16 of 46 positions shown; findings below may reference images not displayed]

FINDINGS: 1.3 cm soft tissue density within the lateral aspect of the left
breast. No enlarged axillary, mediastinal or hilar lymphadenopathy.
Normal heart size. Aortic valvular calcifications. Prominent
subcentimeter mediastinal lymph nodes. No pericardial effusion.

Central airways are patent. Centrilobular emphysematous change.
There is peripheral ground-glass opacity within the right upper lobe
with interlobular septal thickening. Additionally there is a small
area of subpleural ground-glass opacity within the left upper, right
lower and right middle lobes. Subpleural consolidative opacity
within the peripheral right lower lobe. Bandlike opacities within
the right middle and lower lobes favored to represent atelectasis. 4
mm right upper lobe pulmonary nodule. Small right pleural effusion.
No pneumothorax.

Limited visualization of the upper abdomen demonstrates a 1.5 cm
low-attenuation lesion within the left hepatic lobe, favored to
represent a cyst. Gallbladder is decompressed. Nodular thickening of
the bilateral adrenal glands. No aggressive or acute appearing
osseous lesions.
IMPRESSION: 1. 1.3 cm soft tissue density within the left breast is nonspecific.
Recommend correlation with mammography.
2. Bilateral ground-glass pulmonary opacities, the largest area is
within the peripheral right upper lobe. Given the lack of priors,
chronicity of this finding is unknown. In the acute setting this may
represent an infectious or inflammatory process or possibly
asymmetric edema. Short-term followup evaluation in 4- 6 weeks is
recommended to assess for interval improvement.
3. Small right pleural effusion. Subpleural consolidation within the
right lower lobe is favored to represent adjacent atelectasis.
4. Emphysematous change.
5. 4 mm right upper lobe pulmonary nodule. If the patient is at high
risk for bronchogenic carcinoma, follow-up chest CT at 1 year is
recommended. If the patient is at low risk, no follow-up is needed.
This recommendation follows the consensus statement: Guidelines for
Management of Small Pulmonary Nodules Detected on CT Scans: A
Statement from the [HOSPITAL] as published in Radiology

## 2016-01-30 DIAGNOSIS — E119 Type 2 diabetes mellitus without complications: Secondary | ICD-10-CM | POA: Diagnosis not present

## 2016-01-30 LAB — HM DIABETES EYE EXAM

## 2016-02-01 ENCOUNTER — Encounter: Payer: Self-pay | Admitting: Internal Medicine

## 2016-02-25 ENCOUNTER — Other Ambulatory Visit: Payer: Self-pay | Admitting: Internal Medicine

## 2016-03-13 ENCOUNTER — Ambulatory Visit (INDEPENDENT_AMBULATORY_CARE_PROVIDER_SITE_OTHER): Payer: Medicare Other | Admitting: Internal Medicine

## 2016-03-13 ENCOUNTER — Encounter: Payer: Self-pay | Admitting: Internal Medicine

## 2016-03-13 VITALS — BP 110/72 | HR 72 | Temp 97.5°F | Wt 185.0 lb

## 2016-03-13 DIAGNOSIS — E1151 Type 2 diabetes mellitus with diabetic peripheral angiopathy without gangrene: Secondary | ICD-10-CM | POA: Diagnosis not present

## 2016-03-13 DIAGNOSIS — F0280 Dementia in other diseases classified elsewhere without behavioral disturbance: Secondary | ICD-10-CM | POA: Diagnosis not present

## 2016-03-13 DIAGNOSIS — Z111 Encounter for screening for respiratory tuberculosis: Secondary | ICD-10-CM | POA: Diagnosis not present

## 2016-03-13 DIAGNOSIS — I739 Peripheral vascular disease, unspecified: Secondary | ICD-10-CM | POA: Diagnosis not present

## 2016-03-13 DIAGNOSIS — G301 Alzheimer's disease with late onset: Secondary | ICD-10-CM

## 2016-03-13 DIAGNOSIS — Z23 Encounter for immunization: Secondary | ICD-10-CM | POA: Diagnosis not present

## 2016-03-13 DIAGNOSIS — J439 Emphysema, unspecified: Secondary | ICD-10-CM

## 2016-03-13 LAB — HEMOGLOBIN A1C: HEMOGLOBIN A1C: 7.3 % — AB (ref 4.6–6.5)

## 2016-03-13 NOTE — Progress Notes (Signed)
Subjective:    Patient ID: [name], female    DOB: 01/11/1928, 82 y.o.   MRN: [id number]  HPI Here for follow up of Alzheimers, diabetes and other chronic health conditions Here with daughter  Having some leg pain now Daughter thinks this is related to the PAD Never walks No rest pain No ulcers or skin breakdown  [place] goes to [place]now 5 days per week Lives with daughter Needs assist with shower and dressing Feeds herself after set up Uses bathroom herself--but needs prompting.  Wears depends-- frequent urinary incontinence. Rare fecal incontinence  Sugars running up a bit Mostly checking every week or 2 Up as high as 148 and 166 No pain in feet  No chest pain No SOB---does have some wheezing Not using inhalers No regular cough  Current Outpatient Prescriptions on File Prior to Visit  Medication Sig Dispense Refill  . acetaminophen (TYLENOL) 650 MG CR tablet Take 650 mg by mouth every 8 (eight) hours as needed for pain.    . B-D ULTRAFINE III SHORT PEN 31G X 8 MM MISC USE TO ADMINISTER INSULIN ONCE DAILY 90 each 3  . clopidogrel (PLAVIX) 75 MG tablet TAKE 1 TABLET DAILY 90 tablet 3  . Insulin Glargine (LANTUS SOLOSTAR) 100 UNIT/ML Solostar Pen Inject 25 Units into the skin daily at 10 pm. Dx:E11.59 5 pen 0  . Insulin Pen Needle (PEN NEEDLES 31GX5/16") 31G X 8 MM MISC Use to administer insulin once daily dx: E11.59 30 each 0  . metoprolol tartrate (LOPRESSOR) 25 MG tablet TAKE 1 TABLET TWICE A DAY 180 tablet 3  . simvastatin (ZOCOR) 40 MG tablet Take 1 tablet (40 mg total) by mouth daily. 30 tablet 0   No current facility-administered medications on file prior to visit.     No Known Allergies  Past Medical History:  Diagnosis Date  . Allergic rhinitis, cause unspecified   . Alzheimer's dementia without behavioral disturbance   . Carotid stenosis   . COPD (chronic obstructive pulmonary disease) (HCC)   . Coronary atherosclerosis of unspecified type of  vessel, native or graft   . Diabetes mellitus without complication (HCC)   . MI (myocardial infarction)   . Other and unspecified hyperlipidemia   . PAD (peripheral artery disease) (HCC)   . Pneumonia 2012 & 10/15    Past Surgical History:  Procedure Laterality Date  . CARDIAC CATHETERIZATION     [place] x1 stent  . Stent in lower extremity (?femoral)      Family History  Problem Relation Age of Onset  . Heart disease Mother   . Diabetes Father   . COPD Father   . Cancer Brother     Social History   Social History  . Marital status: Widowed    Spouse name: N/A  . Number of children: 3  . Years of education: N/A   Occupational History  . Mill worker--retired    Social History Main Topics  . Smoking status: Former Smoker    Years: 60.00    Types: Cigarettes  . Smokeless tobacco: Never Used  . Alcohol use No  . Drug use: No  . Sexual activity: Not on file   Other Topics Concern  . Not on file   Social History Narrative   Not sure about living will   Daughter [name] has health care POA   DNR confirmed and rewritten 09/08/15   No prior wishes known about tube feeding   Review of Systems Great appetite  Weight stable Sleeps well    Objective:   Physical Exam  Constitutional: She appears well-developed. No distress.  Neck: Normal range of motion. No thyromegaly present.  Cardiovascular: Normal rate, regular rhythm and normal heart sounds.  Exam reveals no gallop.   No murmur heard. Feet cool with faint pulses  Pulmonary/Chest: Effort normal. No respiratory distress. She has no wheezes. She has no rales.  Decreased breath sounds but clear  Musculoskeletal: She exhibits no edema.  No clear pain with hip ROM. Knees seem okay also  Lymphadenopathy:    She has no cervical adenopathy.  Psychiatric: She has a normal mood and affect. Her behavior is normal.          Assessment & Plan:

## 2016-03-13 NOTE — Addendum Note (Signed)
Addended by: Eual FinesBRIDGES, Graviel Payeur P on: 03/13/2016 12:00 PM   Modules accepted: Orders

## 2016-03-13 NOTE — Assessment & Plan Note (Signed)
Sugars up some Will raise the lantus if A1c up a lot

## 2016-03-13 NOTE — Assessment & Plan Note (Signed)
Wheezes only No sig dyspnea

## 2016-03-13 NOTE — Assessment & Plan Note (Signed)
Mild and now increasing care needs Daughter stays with her--discussed hiring caregivers for her to do things [facility]

## 2016-03-13 NOTE — Assessment & Plan Note (Signed)
Increased claudication (no clear arthritis)---no rest pain or skin problems

## 2016-03-13 NOTE — Progress Notes (Signed)
Pre visit review using our clinic review tool, if applicable. No additional management support is needed unless otherwise documented below in the visit note. 

## 2016-03-15 ENCOUNTER — Encounter: Payer: Self-pay | Admitting: *Deleted

## 2016-03-15 LAB — TB SKIN TEST
INDURATION: 0 mm
TB SKIN TEST: NEGATIVE

## 2016-03-20 ENCOUNTER — Telehealth: Payer: Self-pay | Admitting: Internal Medicine

## 2016-03-20 NOTE — Telephone Encounter (Signed)
Pt daughter dropped off forms to be filled out for [facility]. I placed in Rx tower. Please call when ready for pick up.

## 2016-03-20 NOTE — Telephone Encounter (Signed)
Forms placed in Dr Letvak's Inbox on his desk 

## 2016-03-22 NOTE — Telephone Encounter (Signed)
Form done No charge 

## 2016-03-22 NOTE — Telephone Encounter (Signed)
Patients daughter notified form is ready for pick up 

## 2016-04-16 ENCOUNTER — Ambulatory Visit (INDEPENDENT_AMBULATORY_CARE_PROVIDER_SITE_OTHER): Payer: Medicare Other | Admitting: Podiatry

## 2016-04-16 DIAGNOSIS — M79676 Pain in unspecified toe(s): Secondary | ICD-10-CM

## 2016-04-16 DIAGNOSIS — E1151 Type 2 diabetes mellitus with diabetic peripheral angiopathy without gangrene: Secondary | ICD-10-CM

## 2016-04-16 DIAGNOSIS — B351 Tinea unguium: Secondary | ICD-10-CM

## 2016-04-16 NOTE — Progress Notes (Signed)
She presents today for a chief complaint of painful elongated toenails and for diabetic check.  Objective: Vital signs are stable she is alert and oriented 3. I reviewed her past medical history medications allergies and labs today 8.3 as her a hemoglobin A1c which is up slightly from 8.0 7 months ago. Toenails are thick yellow dystrophic onychomycotic no open lesions or wounds are noted. No changes in physical exam.  Assessment: Diabetes mellitus with diabetic peripheral neuropathy. Pain in limb secondary to onychomycosis.  Plan: Debridement toenails bilateral.

## 2016-04-18 ENCOUNTER — Ambulatory Visit: Payer: Medicare Other | Admitting: Podiatry

## 2016-06-20 ENCOUNTER — Telehealth: Payer: Self-pay

## 2016-06-20 NOTE — Telephone Encounter (Signed)
[name] called back; [name] [facility] and request letter with formal diagnosis of pt. Pt presently attends [facility] and [name] is going to try to get her enrolled in some type program [facility] [name] request cb.

## 2016-06-20 NOTE — Telephone Encounter (Signed)
Teresa left V/M (DPR signed) requesting cb about getting a form in writing. Left v/m requesting cb to see what kind of form talking about.

## 2016-06-20 NOTE — Telephone Encounter (Signed)
Spoke to [place]. We decided that the snapshot would be sufficient. It has all the information needed. I fax[facility] it [facility]

## 2016-06-20 NOTE — Telephone Encounter (Signed)
Would a copy of my note be enough?

## 2016-08-15 ENCOUNTER — Ambulatory Visit: Payer: Medicare Other | Admitting: Podiatry

## 2016-09-10 ENCOUNTER — Ambulatory Visit: Payer: Medicare Other | Admitting: Internal Medicine

## 2016-09-20 ENCOUNTER — Other Ambulatory Visit: Payer: Self-pay | Admitting: Internal Medicine

## 2016-09-25 DIAGNOSIS — M79673 Pain in unspecified foot: Secondary | ICD-10-CM | POA: Diagnosis not present

## 2016-09-25 DIAGNOSIS — E119 Type 2 diabetes mellitus without complications: Secondary | ICD-10-CM | POA: Diagnosis not present

## 2016-09-25 DIAGNOSIS — B351 Tinea unguium: Secondary | ICD-10-CM | POA: Diagnosis not present

## 2016-10-18 DIAGNOSIS — R2681 Unsteadiness on feet: Secondary | ICD-10-CM | POA: Diagnosis not present

## 2016-10-18 DIAGNOSIS — Z6828 Body mass index (BMI) 28.0-28.9, adult: Secondary | ICD-10-CM | POA: Diagnosis not present

## 2016-10-18 DIAGNOSIS — J449 Chronic obstructive pulmonary disease, unspecified: Secondary | ICD-10-CM | POA: Diagnosis not present

## 2016-10-18 DIAGNOSIS — E785 Hyperlipidemia, unspecified: Secondary | ICD-10-CM | POA: Diagnosis not present

## 2016-10-18 DIAGNOSIS — Z7182 Exercise counseling: Secondary | ICD-10-CM | POA: Diagnosis not present

## 2016-10-18 DIAGNOSIS — Z87891 Personal history of nicotine dependence: Secondary | ICD-10-CM | POA: Diagnosis not present

## 2016-10-18 DIAGNOSIS — R54 Age-related physical debility: Secondary | ICD-10-CM | POA: Diagnosis not present

## 2016-10-18 DIAGNOSIS — Z794 Long term (current) use of insulin: Secondary | ICD-10-CM | POA: Diagnosis not present

## 2016-10-18 DIAGNOSIS — I10 Essential (primary) hypertension: Secondary | ICD-10-CM | POA: Diagnosis not present

## 2016-10-18 DIAGNOSIS — E119 Type 2 diabetes mellitus without complications: Secondary | ICD-10-CM | POA: Diagnosis not present

## 2016-10-30 ENCOUNTER — Other Ambulatory Visit: Payer: Self-pay | Admitting: Internal Medicine

## 2016-11-23 ENCOUNTER — Other Ambulatory Visit: Payer: Self-pay | Admitting: Internal Medicine

## 2016-12-19 ENCOUNTER — Other Ambulatory Visit: Payer: Self-pay | Admitting: Internal Medicine

## 2017-01-28 ENCOUNTER — Other Ambulatory Visit: Payer: Self-pay | Admitting: Internal Medicine

## 2017-03-22 DIAGNOSIS — M79676 Pain in unspecified toe(s): Secondary | ICD-10-CM | POA: Diagnosis not present

## 2017-03-22 DIAGNOSIS — B351 Tinea unguium: Secondary | ICD-10-CM | POA: Diagnosis not present

## 2017-04-11 DIAGNOSIS — E785 Hyperlipidemia, unspecified: Secondary | ICD-10-CM | POA: Diagnosis not present

## 2017-04-11 DIAGNOSIS — Z713 Dietary counseling and surveillance: Secondary | ICD-10-CM | POA: Diagnosis not present

## 2017-04-11 DIAGNOSIS — M1990 Unspecified osteoarthritis, unspecified site: Secondary | ICD-10-CM | POA: Diagnosis not present

## 2017-04-11 DIAGNOSIS — E119 Type 2 diabetes mellitus without complications: Secondary | ICD-10-CM | POA: Diagnosis not present

## 2017-04-11 DIAGNOSIS — Z6829 Body mass index (BMI) 29.0-29.9, adult: Secondary | ICD-10-CM | POA: Diagnosis not present

## 2017-04-11 DIAGNOSIS — Z7182 Exercise counseling: Secondary | ICD-10-CM | POA: Diagnosis not present

## 2017-04-11 DIAGNOSIS — I739 Peripheral vascular disease, unspecified: Secondary | ICD-10-CM | POA: Diagnosis not present

## 2017-04-11 DIAGNOSIS — Z23 Encounter for immunization: Secondary | ICD-10-CM | POA: Diagnosis not present

## 2017-04-26 DIAGNOSIS — N390 Urinary tract infection, site not specified: Secondary | ICD-10-CM | POA: Diagnosis not present

## 2017-04-26 DIAGNOSIS — F0390 Unspecified dementia without behavioral disturbance: Secondary | ICD-10-CM | POA: Diagnosis not present

## 2017-04-26 DIAGNOSIS — Z87891 Personal history of nicotine dependence: Secondary | ICD-10-CM | POA: Diagnosis not present

## 2017-04-26 DIAGNOSIS — E785 Hyperlipidemia, unspecified: Secondary | ICD-10-CM | POA: Diagnosis not present

## 2017-04-26 DIAGNOSIS — I10 Essential (primary) hypertension: Secondary | ICD-10-CM | POA: Diagnosis not present

## 2017-04-26 DIAGNOSIS — J449 Chronic obstructive pulmonary disease, unspecified: Secondary | ICD-10-CM | POA: Diagnosis not present

## 2017-04-26 DIAGNOSIS — R4182 Altered mental status, unspecified: Secondary | ICD-10-CM | POA: Diagnosis not present

## 2017-04-26 DIAGNOSIS — R9431 Abnormal electrocardiogram [ECG] [EKG]: Secondary | ICD-10-CM | POA: Diagnosis not present

## 2017-04-28 ENCOUNTER — Other Ambulatory Visit: Payer: Self-pay | Admitting: Internal Medicine

## 2017-04-29 NOTE — Telephone Encounter (Signed)
Not in Memory care---just goes to day program (as of last visit) Okay to fill 2 months and she is way overdue for appt

## 2017-04-29 NOTE — Telephone Encounter (Signed)
Is [facility]? I wasn't sure if we still do her medications or if she gets them from [facility]

## 2017-06-12 DIAGNOSIS — B351 Tinea unguium: Secondary | ICD-10-CM | POA: Diagnosis not present

## 2017-06-12 DIAGNOSIS — M79676 Pain in unspecified toe(s): Secondary | ICD-10-CM | POA: Diagnosis not present

## 2017-06-12 DIAGNOSIS — E119 Type 2 diabetes mellitus without complications: Secondary | ICD-10-CM | POA: Diagnosis not present

## 2017-07-27 ENCOUNTER — Other Ambulatory Visit: Payer: Self-pay | Admitting: Internal Medicine

## 2017-08-15 DIAGNOSIS — M79676 Pain in unspecified toe(s): Secondary | ICD-10-CM | POA: Diagnosis not present

## 2017-08-15 DIAGNOSIS — B351 Tinea unguium: Secondary | ICD-10-CM | POA: Diagnosis not present

## 2017-08-15 DIAGNOSIS — E119 Type 2 diabetes mellitus without complications: Secondary | ICD-10-CM | POA: Diagnosis not present

## 2017-10-29 DIAGNOSIS — E119 Type 2 diabetes mellitus without complications: Secondary | ICD-10-CM | POA: Diagnosis not present

## 2017-10-29 DIAGNOSIS — M79676 Pain in unspecified toe(s): Secondary | ICD-10-CM | POA: Diagnosis not present

## 2017-10-29 DIAGNOSIS — B351 Tinea unguium: Secondary | ICD-10-CM | POA: Diagnosis not present

## 2017-10-31 DIAGNOSIS — Z7182 Exercise counseling: Secondary | ICD-10-CM | POA: Diagnosis not present

## 2017-10-31 DIAGNOSIS — Z87891 Personal history of nicotine dependence: Secondary | ICD-10-CM | POA: Diagnosis not present

## 2017-10-31 DIAGNOSIS — R2681 Unsteadiness on feet: Secondary | ICD-10-CM | POA: Diagnosis not present

## 2017-10-31 DIAGNOSIS — Z6829 Body mass index (BMI) 29.0-29.9, adult: Secondary | ICD-10-CM | POA: Diagnosis not present

## 2017-10-31 DIAGNOSIS — F0390 Unspecified dementia without behavioral disturbance: Secondary | ICD-10-CM | POA: Diagnosis not present

## 2017-10-31 DIAGNOSIS — M1990 Unspecified osteoarthritis, unspecified site: Secondary | ICD-10-CM | POA: Diagnosis not present

## 2017-10-31 DIAGNOSIS — E119 Type 2 diabetes mellitus without complications: Secondary | ICD-10-CM | POA: Diagnosis not present

## 2017-10-31 DIAGNOSIS — I739 Peripheral vascular disease, unspecified: Secondary | ICD-10-CM | POA: Diagnosis not present

## 2017-10-31 DIAGNOSIS — Z713 Dietary counseling and surveillance: Secondary | ICD-10-CM | POA: Diagnosis not present

## 2017-10-31 DIAGNOSIS — I10 Essential (primary) hypertension: Secondary | ICD-10-CM | POA: Diagnosis not present

## 2017-10-31 DIAGNOSIS — R3915 Urgency of urination: Secondary | ICD-10-CM | POA: Diagnosis not present

## 2017-10-31 DIAGNOSIS — E785 Hyperlipidemia, unspecified: Secondary | ICD-10-CM | POA: Diagnosis not present

## 2017-12-16 DIAGNOSIS — M1990 Unspecified osteoarthritis, unspecified site: Secondary | ICD-10-CM | POA: Diagnosis not present

## 2017-12-16 DIAGNOSIS — F0390 Unspecified dementia without behavioral disturbance: Secondary | ICD-10-CM | POA: Diagnosis not present

## 2017-12-16 DIAGNOSIS — R54 Age-related physical debility: Secondary | ICD-10-CM | POA: Diagnosis not present

## 2017-12-16 DIAGNOSIS — Z713 Dietary counseling and surveillance: Secondary | ICD-10-CM | POA: Diagnosis not present

## 2017-12-16 DIAGNOSIS — Z6829 Body mass index (BMI) 29.0-29.9, adult: Secondary | ICD-10-CM | POA: Diagnosis not present

## 2017-12-16 DIAGNOSIS — R451 Restlessness and agitation: Secondary | ICD-10-CM | POA: Diagnosis not present

## 2017-12-16 DIAGNOSIS — J449 Chronic obstructive pulmonary disease, unspecified: Secondary | ICD-10-CM | POA: Diagnosis not present

## 2017-12-16 DIAGNOSIS — E119 Type 2 diabetes mellitus without complications: Secondary | ICD-10-CM | POA: Diagnosis not present

## 2017-12-16 DIAGNOSIS — E785 Hyperlipidemia, unspecified: Secondary | ICD-10-CM | POA: Diagnosis not present

## 2017-12-16 DIAGNOSIS — I10 Essential (primary) hypertension: Secondary | ICD-10-CM | POA: Diagnosis not present

## 2017-12-16 DIAGNOSIS — Z1331 Encounter for screening for depression: Secondary | ICD-10-CM | POA: Diagnosis not present

## 2017-12-16 DIAGNOSIS — Z87891 Personal history of nicotine dependence: Secondary | ICD-10-CM | POA: Diagnosis not present

## 2018-01-01 DIAGNOSIS — E119 Type 2 diabetes mellitus without complications: Secondary | ICD-10-CM | POA: Diagnosis not present

## 2018-01-01 DIAGNOSIS — B351 Tinea unguium: Secondary | ICD-10-CM | POA: Diagnosis not present

## 2018-01-01 DIAGNOSIS — M79676 Pain in unspecified toe(s): Secondary | ICD-10-CM | POA: Diagnosis not present

## 2018-01-09 ENCOUNTER — Other Ambulatory Visit: Payer: Self-pay | Admitting: Internal Medicine

## 2018-03-17 DIAGNOSIS — E119 Type 2 diabetes mellitus without complications: Secondary | ICD-10-CM | POA: Diagnosis not present

## 2018-03-17 DIAGNOSIS — B351 Tinea unguium: Secondary | ICD-10-CM | POA: Diagnosis not present

## 2018-03-17 DIAGNOSIS — M79676 Pain in unspecified toe(s): Secondary | ICD-10-CM | POA: Diagnosis not present
